# Patient Record
Sex: Female | Born: 1950 | Race: White | Hispanic: No | Marital: Single | Smoking: Never smoker
Health system: Southern US, Community
[De-identification: ages and names within clinical notes are randomized; demographics above are authoritative.]

## PROBLEM LIST (undated history)

## (undated) DIAGNOSIS — A64 Unspecified sexually transmitted disease: Secondary | ICD-10-CM

## (undated) DIAGNOSIS — H359 Unspecified retinal disorder: Secondary | ICD-10-CM

## (undated) DIAGNOSIS — M810 Age-related osteoporosis without current pathological fracture: Secondary | ICD-10-CM

## (undated) DIAGNOSIS — K219 Gastro-esophageal reflux disease without esophagitis: Secondary | ICD-10-CM

## (undated) DIAGNOSIS — E785 Hyperlipidemia, unspecified: Secondary | ICD-10-CM

## (undated) DIAGNOSIS — B019 Varicella without complication: Secondary | ICD-10-CM

## (undated) HISTORY — DX: Gastro-esophageal reflux disease without esophagitis: K21.9

## (undated) HISTORY — PX: TUBAL LIGATION: SHX77

## (undated) HISTORY — DX: Varicella without complication: B01.9

## (undated) HISTORY — DX: Age-related osteoporosis without current pathological fracture: M81.0

## (undated) HISTORY — DX: Unspecified retinal disorder: H35.9

## (undated) HISTORY — DX: Unspecified sexually transmitted disease: A64

## (undated) HISTORY — PX: EYE SURGERY: SHX253

## (undated) HISTORY — DX: Hyperlipidemia, unspecified: E78.5

---

## 1999-12-23 ENCOUNTER — Other Ambulatory Visit: Admission: RE | Admit: 1999-12-23 | Discharge: 1999-12-23 | Payer: Self-pay

## 1999-12-23 ENCOUNTER — Other Ambulatory Visit: Admission: RE | Admit: 1999-12-23 | Discharge: 1999-12-23 | Payer: Self-pay | Admitting: Gynecology

## 2001-01-04 ENCOUNTER — Other Ambulatory Visit: Admission: RE | Admit: 2001-01-04 | Discharge: 2001-01-04 | Payer: Self-pay | Admitting: Gynecology

## 2002-09-02 ENCOUNTER — Other Ambulatory Visit: Admission: RE | Admit: 2002-09-02 | Discharge: 2002-09-02 | Payer: Self-pay | Admitting: Gynecology

## 2003-11-02 ENCOUNTER — Other Ambulatory Visit: Admission: RE | Admit: 2003-11-02 | Discharge: 2003-11-02 | Payer: Self-pay | Admitting: Gynecology

## 2004-11-19 ENCOUNTER — Other Ambulatory Visit: Admission: RE | Admit: 2004-11-19 | Discharge: 2004-11-19 | Payer: Self-pay | Admitting: Gynecology

## 2005-12-22 ENCOUNTER — Other Ambulatory Visit: Admission: RE | Admit: 2005-12-22 | Discharge: 2005-12-22 | Payer: Self-pay | Admitting: Gynecology

## 2006-02-03 ENCOUNTER — Encounter: Admission: RE | Admit: 2006-02-03 | Discharge: 2006-02-03 | Payer: Self-pay | Admitting: Gynecology

## 2007-03-23 ENCOUNTER — Encounter: Admission: RE | Admit: 2007-03-23 | Discharge: 2007-03-23 | Payer: Self-pay | Admitting: Gynecology

## 2008-06-21 ENCOUNTER — Encounter: Admission: RE | Admit: 2008-06-21 | Discharge: 2008-06-21 | Payer: Self-pay | Admitting: Gynecology

## 2008-12-02 ENCOUNTER — Emergency Department (HOSPITAL_COMMUNITY): Admission: EM | Admit: 2008-12-02 | Discharge: 2008-12-02 | Payer: Self-pay | Admitting: Emergency Medicine

## 2008-12-05 ENCOUNTER — Emergency Department (HOSPITAL_COMMUNITY): Admission: EM | Admit: 2008-12-05 | Discharge: 2008-12-05 | Payer: Self-pay | Admitting: Emergency Medicine

## 2008-12-06 ENCOUNTER — Ambulatory Visit: Payer: Self-pay | Admitting: Internal Medicine

## 2008-12-06 DIAGNOSIS — E785 Hyperlipidemia, unspecified: Secondary | ICD-10-CM | POA: Insufficient documentation

## 2008-12-06 LAB — CONVERTED CEMR LAB
Alkaline Phosphatase: 70 units/L (ref 39–117)
BUN: 14 mg/dL (ref 6–23)
Basophils Absolute: 0.1 10*3/uL (ref 0.0–0.1)
Bilirubin, Direct: 0.1 mg/dL (ref 0.0–0.3)
CO2: 28 meq/L (ref 19–32)
Chloride: 105 meq/L (ref 96–112)
Eosinophils Absolute: 0.2 10*3/uL (ref 0.0–0.7)
HCT: 38.2 % (ref 36.0–46.0)
HDL: 50.8 mg/dL (ref >39.00)
Hemoglobin: 13.1 g/dL (ref 12.0–15.0)
Lymphs Abs: 1.3 10*3/uL (ref 0.7–4.0)
MCHC: 34.2 g/dL (ref 30.0–36.0)
MCV: 87 fL (ref 78.0–100.0)
Monocytes Relative: 7.5 % (ref 3.0–12.0)
RBC: 4.39 M/uL (ref 3.87–5.11)
RDW: 12 % (ref 11.5–14.6)
TSH: 2.15 microintl units/mL (ref 0.35–5.50)
Total Bilirubin: 1.4 mg/dL — ABNORMAL HIGH (ref 0.3–1.2)
Total Protein: 6.9 g/dL (ref 6.0–8.3)

## 2008-12-19 ENCOUNTER — Telehealth: Payer: Self-pay | Admitting: Internal Medicine

## 2009-12-12 ENCOUNTER — Ambulatory Visit: Payer: Self-pay | Admitting: Internal Medicine

## 2009-12-12 DIAGNOSIS — J069 Acute upper respiratory infection, unspecified: Secondary | ICD-10-CM | POA: Insufficient documentation

## 2010-03-26 NOTE — Assessment & Plan Note (Signed)
Summary: SINUS INF/CJR   Vital Signs:  Patient profile:   60 year old female Weight:      129 pounds Temp:     98.3 degrees F oral BP sitting:   110 / 70  (right arm) Cuff size:   regular  Vitals Entered By: Duard Brady LPN (December 12, 2009 10:14 AM) CC: sinus congestion and pressure    **declines flu vaccine Is Patient Diabetic? No   CC:  sinus congestion and pressure    **declines flu vaccine.  History of Present Illness: 60 year old patient who is seen today in with a two to 3 week history of sinus congestion, postnasal drip, and sinus pressure.  No localized sinus or dental pain.  No fever or other constitutional complaints.  No purulent drainage.  She states that she had a sinus infection that was quite severe.  years ago. she has a history of mild dyslipidemia diet controlled  Allergies: 1)  ! Bactrim (Sulfamethoxazole-Trimethoprim)  Past History:  Past Medical History: Reviewed history from 12/06/2008 and no changes required. Hyperlipidemia  Review of Systems  The patient denies anorexia, fever, weight loss, weight gain, vision loss, decreased hearing, hoarseness, chest pain, syncope, dyspnea on exertion, peripheral edema, prolonged cough, headaches, hemoptysis, abdominal pain, melena, hematochezia, severe indigestion/heartburn, hematuria, incontinence, genital sores, muscle weakness, suspicious skin lesions, transient blindness, difficulty walking, depression, unusual weight change, abnormal bleeding, enlarged lymph nodes, angioedema, and breast masses.    Physical Exam  General:  Well-developed,well-nourished,in no acute distress; alert,appropriate and cooperative throughout examination Head:  Normocephalic and atraumatic without obvious abnormalities. No apparent alopecia or balding. Eyes:  No corneal or conjunctival inflammation noted. EOMI. Perrla. Funduscopic exam benign, without hemorrhages, exudates or papilledema. Vision grossly normal. Ears:  External  ear exam shows no significant lesions or deformities.  Otoscopic examination reveals clear canals, tympanic membranes are intact bilaterally without bulging, retraction, inflammation or discharge. Hearing is grossly normal bilaterally. Mouth:  Oral mucosa and oropharynx without lesions or exudates.  Teeth in good repair. Neck:  No deformities, masses, or tenderness noted. Lungs:  Normal respiratory effort, chest expands symmetrically. Lungs are clear to auscultation, no crackles or wheezes.   Impression & Recommendations:  Problem # 1:  URI (ICD-465.9)  Problem # 2:  HYPERLIPIDEMIA (ICD-272.4)  Patient Instructions: 1)  Get plenty of rest, drink lots of clear liquids, and use Tylenol or Ibuprofen for fever and comfort. Return in 7-10 days if you're not better:sooner if you're feeling worse. 2)  Mucinex D twice daily 3)  Omnaris- use twice daily    Orders Added: 1)  Est. Patient Level III [62130]

## 2011-03-27 ENCOUNTER — Ambulatory Visit: Payer: BC Managed Care – PPO

## 2011-03-27 ENCOUNTER — Ambulatory Visit (INDEPENDENT_AMBULATORY_CARE_PROVIDER_SITE_OTHER): Payer: BC Managed Care – PPO | Admitting: Family Medicine

## 2011-03-27 VITALS — BP 148/82 | HR 68 | Temp 98.4°F | Resp 18 | Ht 64.0 in | Wt 124.0 lb

## 2011-03-27 DIAGNOSIS — R05 Cough: Secondary | ICD-10-CM

## 2011-03-27 DIAGNOSIS — R059 Cough, unspecified: Secondary | ICD-10-CM

## 2011-03-27 MED ORDER — LEVOFLOXACIN 500 MG PO TABS: 500.0000 mg | ORAL_TABLET | Freq: Every day | ORAL | Status: AC

## 2011-03-27 MED ORDER — HYDROCODONE-HOMATROPINE 5-1.5 MG/5ML PO SYRP: 5.0000 mL | ORAL_SOLUTION | Freq: Four times a day (QID) | ORAL | Status: AC | PRN

## 2011-03-27 NOTE — Progress Notes (Signed)
  Subjective:    Patient ID: Michelle Cook, female    DOB: Aug 04, 1950, 61 y.o.   MRN: [id number]  HPI    Review of Systems     Objective:   Physical Exam  [facility] reading (PRIMARY) by  Dr. Milus Glazier:  Scoliosis, no infiltrate       Assessment & Plan:

## 2011-03-27 NOTE — Patient Instructions (Addendum)
Health Maintenance, Females A healthy lifestyle and preventative care can promote health and wellness.  Maintain regular health, dental, and eye exams.   Eat a healthy diet. Foods like vegetables, fruits, whole grains, low-fat dairy products, and lean protein foods contain the nutrients you need without too many calories. Decrease your intake of foods high in solid fats, added sugars, and salt. Get information about a proper diet from your caregiver, if necessary.   Regular physical exercise is one of the most important things you can do for your health. Most adults should get at least 150 minutes of moderate-intensity exercise (any activity that increases your heart rate and causes you to sweat) each week. In addition, most adults need muscle-strengthening exercises on 2 or more days a week.    Maintain a healthy weight. The body mass index (BMI) is a screening tool to identify possible weight problems. It provides an estimate of body fat based on height and weight. Your caregiver can help determine your BMI, and can help you achieve or maintain a healthy weight. For adults 20 years and older:   A BMI below 18.5 is considered underweight.   A BMI of 18.5 to 24.9 is normal.   A BMI of 25 to 29.9 is considered overweight.   A BMI of 30 and above is considered obese.   Maintain normal blood lipids and cholesterol by exercising and minimizing your intake of saturated fat. Eat a balanced diet with plenty of fruits and vegetables. Blood tests for lipids and cholesterol should begin at age 20 and be repeated every 5 years. If your lipid or cholesterol levels are high, you are over 50, or you are a high risk for heart disease, you may need your cholesterol levels checked more frequently.Ongoing high lipid and cholesterol levels should be treated with medicines if diet and exercise are not effective.   If you smoke, find out from your caregiver how to quit. If you do not use tobacco, do not start.    If you are pregnant, do not drink alcohol. If you are breastfeeding, be very cautious about drinking alcohol. If you are not pregnant and choose to drink alcohol, do not exceed 1 drink per day. One drink is considered to be 12 ounces (355 mL) of beer, 5 ounces (148 mL) of wine, or 1.5 ounces (44 mL) of liquor.   Avoid use of street drugs. Do not share needles with anyone. Ask for help if you need support or instructions about stopping the use of drugs.   High blood pressure causes heart disease and increases the risk of stroke. Blood pressure should be checked at least every 1 to 2 years. Ongoing high blood pressure should be treated with medicines, if weight loss and exercise are not effective.   If you are 55 to 61 years old, ask your caregiver if you should take aspirin to prevent strokes.   Diabetes screening involves taking a blood sample to check your fasting blood sugar level. This should be done once every 3 years, after age 45, if you are within normal weight and without risk factors for diabetes. Testing should be considered at a younger age or be carried out more frequently if you are overweight and have at least 1 risk factor for diabetes.   Breast cancer screening is essential preventative care for women. You should practice "breast self-awareness." This means understanding the normal appearance and feel of your breasts and may include breast self-examination. Any changes detected, no matter how   small, should be reported to a caregiver. Women in their 20s and 30s should have a clinical breast exam (CBE) by a caregiver as part of a regular health exam every 1 to 3 years. After age 40, women should have a CBE every year. Starting at age 40, women should consider having a mammogram (breast X-ray) every year. Women who have a family history of breast cancer should talk to their caregiver about genetic screening. Women at a high risk of breast cancer should talk to their caregiver about having  an MRI and a mammogram every year.   The Pap test is a screening test for cervical cancer. Women should have a Pap test starting at age 21. Between ages 21 and 29, Pap tests should be repeated every 2 years. Beginning at age 30, you should have a Pap test every 3 years as long as the past 3 Pap tests have been normal. If you had a hysterectomy for a problem that was not cancer or a condition that could lead to cancer, then you no longer need Pap tests. If you are between ages 65 and 70, and you have had normal Pap tests going back 10 years, you no longer need Pap tests. If you have had past treatment for cervical cancer or a condition that could lead to cancer, you need Pap tests and screening for cancer for at least 20 years after your treatment. If Pap tests have been discontinued, risk factors (such as a new sexual partner) need to be reassessed to determine if screening should be resumed. Some women have medical problems that increase the chance of getting cervical cancer. In these cases, your caregiver may recommend more frequent screening and Pap tests.   The human papillomavirus (HPV) test is an additional test that may be used for cervical cancer screening. The HPV test looks for the virus that can cause the cell changes on the cervix. The cells collected during the Pap test can be tested for HPV. The HPV test could be used to screen women aged 30 years and older, and should be used in women of any age who have unclear Pap test results. After the age of 30, women should have HPV testing at the same frequency as a Pap test.   Colorectal cancer can be detected and often prevented. Most routine colorectal cancer screening begins at the age of 50 and continues through age 75. However, your caregiver may recommend screening at an earlier age if you have risk factors for colon cancer. On a yearly basis, your caregiver may provide home test kits to check for hidden blood in the stool. Use of a small camera at  the end of a tube, to directly examine the colon (sigmoidoscopy or colonoscopy), can detect the earliest forms of colorectal cancer. Talk to your caregiver about this at age 50, when routine screening begins. Direct examination of the colon should be repeated every 5 to 10 years through age 75, unless early forms of pre-cancerous polyps or small growths are found.   Practice safe sex. Use condoms and avoid high-risk sexual practices to reduce the spread of sexually transmitted infections (STIs). Sexually active women aged 25 and younger should be checked for Chlamydia, which is a common sexually transmitted infection. Older women with new or multiple partners should also be tested for Chlamydia. Testing for other STIs is recommended if you are sexually active and at increased risk.   Osteoporosis is a disease in which the bones lose minerals and   strength with aging. This can result in serious bone fractures. The risk of osteoporosis can be identified using a bone density scan. Women ages 52 and over and women at risk for fractures or osteoporosis should discuss screening with their caregivers. Ask your caregiver whether you should be taking a calcium supplement or vitamin D to reduce the rate of osteoporosis.   Menopause can be associated with physical symptoms and risks. Hormone replacement therapy is available to decrease symptoms and risks. You should talk to your caregiver about whether hormone replacement therapy is right for you.   Use sunscreen with a sun protection factor (SPF) of 30 or greater. Apply sunscreen liberally and repeatedly throughout the day. You should seek shade when your shadow is shorter than you. Protect yourself by wearing long sleeves, pants, a wide-brimmed hat, and sunglasses year round, whenever you are outdoors.   Notify your caregiver of new moles or changes in moles, especially if there is a change in shape or color. Also notify your caregiver if a mole is larger than the  size of a pencil eraser.   Stay current with your immunizations.  Document Released: 08/26/2010 Document Revised: 10/23/2010 Document Reviewed: 08/26/2010 [facility] Patient Information 2012 [place], [place].Bronchitis Bronchitis is the body's way of reacting to injury and/or infection (inflammation) of the bronchi. Bronchi are the air tubes that extend from the windpipe into the lungs. If the inflammation becomes severe, it may cause shortness of breath. CAUSES  Inflammation may be caused by:  A virus.   Germs (bacteria).   Dust.   Allergens.   Pollutants and many other irritants.  The cells lining the bronchial tree are covered with tiny hairs (cilia). These constantly beat upward, away from the lungs, toward the mouth. This keeps the lungs free of pollutants. When these cells become too irritated and are unable to do their job, mucus begins to develop. This causes the characteristic cough of bronchitis. The cough clears the lungs when the cilia are unable to do their job. Without either of these protective mechanisms, the mucus would settle in the lungs. Then you would develop pneumonia. Smoking is a common cause of bronchitis and can contribute to pneumonia. Stopping this habit is the single most important thing you can do to help yourself. TREATMENT   Your caregiver may prescribe an antibiotic if the cough is caused by bacteria. Also, medicines that open up your airways make it easier to breathe. Your caregiver may also recommend or prescribe an expectorant. It will loosen the mucus to be coughed up. Only take over-the-counter or prescription medicines for pain, discomfort, or fever as directed by your caregiver.   Removing whatever causes the problem (smoking, for example) is critical to preventing the problem from getting worse.   Cough suppressants may be prescribed for relief of cough symptoms.   Inhaled medicines may be prescribed to help with symptoms now and to help prevent  problems from returning.   For those with recurrent (chronic) bronchitis, there may be a need for steroid medicines.  SEEK IMMEDIATE MEDICAL CARE IF:   During treatment, you develop more pus-like mucus (purulent sputum).   You have a fever.   Your baby is older than 3 months with a rectal temperature of 102 F (38.9 C) or higher.   Your baby is 53 months old or younger with a rectal temperature of 100.4 F (38 C) or higher.   You become progressively more ill.   You have increased difficulty breathing, wheezing, or shortness  of breath.  It is necessary to seek immediate medical care if you are elderly or sick from any other disease. MAKE SURE YOU:   Understand these instructions.   Will watch your condition.   Will get help right away if you are not doing well or get worse.  Document Released: 02/10/2005 Document Revised: 10/23/2010 Document Reviewed: 12/21/2007 [facility] Patient Information 2012 [place], [place].

## 2011-03-27 NOTE — Progress Notes (Signed)
  Subjective:    Patient ID: Michelle Cook, female    DOB: 1950-05-01, 61 y.o.   MRN: [id number]  URI  This is a new problem. The current episode started 1 to 4 weeks ago. The problem has been gradually worsening. There has been no fever. Associated symptoms include coughing. Pertinent negatives include no congestion or ear pain. She has tried decongestant for the symptoms. The treatment provided no relief.  Cough Pertinent negatives include no ear pain.      Review of Systems  HENT: Negative for ear pain and congestion.   Respiratory: Positive for cough.   All other systems reviewed and are negative.       Objective:   Physical Exam  Constitutional: She is oriented to person, place, and time. She appears well-developed and well-nourished.  HENT:  Head: Normocephalic and atraumatic.  Right Ear: External ear normal.  Left Ear: External ear normal.  Mouth/Throat: Oropharynx is clear and moist.  Eyes: Conjunctivae are normal. Pupils are equal, round, and reactive to light.  Neck: Normal range of motion. Neck supple.  Cardiovascular: Normal rate, regular rhythm, normal heart sounds and intact distal pulses.   Pulmonary/Chest: She has rales (right lower lobe).  Musculoskeletal: Normal range of motion.  Neurological: She is alert and oriented to person, place, and time.  Skin: Skin is warm and dry.  Psychiatric: She has a normal mood and affect. Her behavior is normal. Judgment and thought content normal.          Assessment & Plan:

## 2011-04-01 ENCOUNTER — Other Ambulatory Visit: Payer: Self-pay | Admitting: Family Medicine

## 2011-04-03 ENCOUNTER — Telehealth: Payer: Self-pay

## 2011-04-03 NOTE — Telephone Encounter (Signed)
Patient saying that she is not completely better from her visit from Dr L, would like another antibiotic called in if possible   [facility] on [address]

## 2011-04-04 ENCOUNTER — Other Ambulatory Visit: Payer: Self-pay | Admitting: Family Medicine

## 2011-04-04 MED ORDER — AMOXICILLIN 875 MG PO TABS: 875.0000 mg | ORAL_TABLET | Freq: Two times a day (BID) | ORAL | Status: AC

## 2011-04-04 NOTE — Telephone Encounter (Signed)
I ordered amoxicillin at pharmacy

## 2011-04-04 NOTE — Telephone Encounter (Signed)
Notified pt Abx sent to pharmacy.

## 2012-05-18 ENCOUNTER — Ambulatory Visit (INDEPENDENT_AMBULATORY_CARE_PROVIDER_SITE_OTHER): Payer: Self-pay | Admitting: Internal Medicine

## 2012-05-18 ENCOUNTER — Encounter: Payer: Self-pay | Admitting: Internal Medicine

## 2012-05-18 VITALS — BP 120/80 | HR 64 | Temp 98.0°F | Resp 18 | Wt 117.0 lb

## 2012-05-18 DIAGNOSIS — R8290 Unspecified abnormal findings in urine: Secondary | ICD-10-CM

## 2012-05-18 DIAGNOSIS — R82998 Other abnormal findings in urine: Secondary | ICD-10-CM

## 2012-05-18 DIAGNOSIS — M545 Low back pain, unspecified: Secondary | ICD-10-CM

## 2012-05-18 LAB — POCT URINALYSIS DIPSTICK
Nitrite, UA: POSITIVE
Spec Grav, UA: 1.01

## 2012-05-18 MED ORDER — CIPROFLOXACIN HCL 500 MG PO TABS: 500.0000 mg | ORAL_TABLET | Freq: Two times a day (BID) | ORAL | Status: DC

## 2012-05-18 NOTE — Progress Notes (Signed)
  Subjective:    Patient ID: Michelle Cook, female    DOB: 01/05/1951, 62 y.o.   MRN: [id number]  HPI  62 year old patient who presents with a third a history of low back pain. She also describes concentrated urine with a glide in strong odor. Urinalysis was reviewed and did reveal positive nitrates pyuria and small amount of hematuria. Patient denies any fever urinary frequency urgency or dysuria. She has had remote urinary tract infections only.  History reviewed. No pertinent past medical history.  History   Social History  . Marital Status: Single    Spouse Name: N/A    Number of Children: N/A  . Years of Education: N/A   Occupational History  . Not on file.   Social History Main Topics  . Smoking status: Never Smoker   . Smokeless tobacco: Not on file  . Alcohol Use: Not on file  . Drug Use: Not on file  . Sexually Active: Not on file   Other Topics Concern  . Not on file   Social History Narrative  . No narrative on file    History reviewed. No pertinent past surgical history.  No family history on file.  Allergies  Allergen Reactions  . Sulfamethoxazole W-Trimethoprim   . Sulfa Antibiotics Rash    No current outpatient prescriptions on file prior to visit.   No current facility-administered medications on file prior to visit.    BP 120/80  Pulse 64  Temp(Src) 98 F (36.7 C) (Oral)  Resp 18  Wt 117 lb (53.071 kg)  BMI 20.07 kg/m2  SpO2 99%        Review of Systems  Constitutional: Negative.   HENT: Negative for hearing loss, congestion, sore throat, rhinorrhea, dental problem, sinus pressure and tinnitus.   Eyes: Negative for pain, discharge and visual disturbance.  Respiratory: Negative for cough and shortness of breath.   Cardiovascular: Negative for chest pain, palpitations and leg swelling.  Gastrointestinal: Negative for nausea, vomiting, abdominal pain, diarrhea, constipation, blood in stool and abdominal distention.  Genitourinary:  Negative for dysuria, urgency, frequency, hematuria, flank pain, vaginal bleeding, vaginal discharge, difficulty urinating, vaginal pain and pelvic pain.  Musculoskeletal: Positive for back pain. Negative for joint swelling, arthralgias and gait problem.  Skin: Negative for rash.  Neurological: Negative for dizziness, syncope, speech difficulty, weakness, numbness and headaches.  Hematological: Negative for adenopathy.  Psychiatric/Behavioral: Negative for behavioral problems, dysphoric mood and agitation. The patient is not nervous/anxious.        Objective:   Physical Exam  Constitutional: She appears well-developed and well-nourished. She appears distressed.  Abdominal: Soft. Bowel sounds are normal. She exhibits no distension. There is no tenderness. There is no rebound.  Musculoskeletal: She exhibits no tenderness.  Scoliosis lumbar spine          Assessment & Plan:   Early UTI. Will treat with a three-day course of Cipro Low back pain. Suspect musculoligamentous. Will continued anti-inflammatory medications.

## 2012-05-18 NOTE — Patient Instructions (Signed)
Take your antibiotic as prescribed until ALL of it is gone, but stop if you develop a rash, swelling, or any side effects of the medication.  Contact our office as soon as possible if  there are side effects of the medication.  Drink as much fluid as you  can tolerate over the next few days 

## 2012-05-21 ENCOUNTER — Telehealth: Payer: Self-pay | Admitting: Internal Medicine

## 2012-05-21 NOTE — Telephone Encounter (Signed)
Spoke to pt told her do not need to repeat urine unless having symptoms and make sure complete all antibiotic. Pt verbalized understanding and stated did finish abx.

## 2012-05-21 NOTE — Telephone Encounter (Signed)
Pt was seen on 3-25 and had protein and blood in urine. Pt was on abx for 3 days. Pt would like to know should she repeat UA.

## 2012-06-30 ENCOUNTER — Ambulatory Visit: Payer: BC Managed Care – PPO

## 2012-06-30 ENCOUNTER — Other Ambulatory Visit: Payer: Self-pay | Admitting: Family Medicine

## 2012-06-30 ENCOUNTER — Ambulatory Visit (INDEPENDENT_AMBULATORY_CARE_PROVIDER_SITE_OTHER): Payer: BC Managed Care – PPO | Admitting: Family Medicine

## 2012-06-30 VITALS — BP 134/78 | HR 66 | Temp 98.3°F | Resp 16 | Ht 64.5 in | Wt 111.0 lb

## 2012-06-30 DIAGNOSIS — R634 Abnormal weight loss: Secondary | ICD-10-CM

## 2012-06-30 DIAGNOSIS — R059 Cough, unspecified: Secondary | ICD-10-CM

## 2012-06-30 DIAGNOSIS — R05 Cough: Secondary | ICD-10-CM

## 2012-06-30 DIAGNOSIS — R509 Fever, unspecified: Secondary | ICD-10-CM

## 2012-06-30 LAB — POCT CBC
Granulocyte percent: 79.2 %G (ref 37–80)
Lymph, poc: 1.6 (ref 0.6–3.4)
MCV: 88.1 fL (ref 80–97)
MPV: 10.2 fL (ref 0–99.8)
POC Granulocyte: 7.8 — AB (ref 2–6.9)
POC LYMPH PERCENT: 15.8 %L (ref 10–50)
POC MID %: 5 %M (ref 0–12)
WBC: 9.9 10*3/uL (ref 4.6–10.2)

## 2012-06-30 LAB — COMPREHENSIVE METABOLIC PANEL
AST: 23 U/L (ref 0–37)
BUN: 10 mg/dL (ref 6–23)
CO2: 26 mEq/L (ref 19–32)
Sodium: 140 mEq/L (ref 135–145)

## 2012-06-30 MED ORDER — AZITHROMYCIN 250 MG PO TABS: ORAL_TABLET | ORAL | Status: DC

## 2012-06-30 NOTE — Progress Notes (Addendum)
[facility] [address], [place] [address] [id number] [phone]  Date:  06/30/2012   Name:  Michelle Cook   DOB:  Jul 12, 1950   MRN:  [id number]  PCP:  Rogelia Boga, MD    Chief Complaint: Cough, Nasal Congestion, Fever, Extremity Weakness and Anorexia   History of Present Illness:  Michelle Cook is a 62 y.o. very pleasant female patient who presents with the following:  Here today with illness. In March of this year she began to notice a cough and congestion.  The congestion has resolved, but she has continued to cough. "It is like a smoker's cough but I do not smoke."   She has felt tired and fatigued.  She has lost about 15 lbs over the last 8 months.  She has not been trying to lose weight.  She has been working a lot and been under stress due to tax season (she works for a [employer])   Last night she noted a low grade fever up to just over 100 degrees.  The fever has resolved today.   She has noted some body aches and chills.    She had a UTI in February which was treated by her PCP.  These symptoms have cleared up.    She had a colonoscopy in her early 11's, less than 10 years ago.  It was normal Her last mammo was 2 or 3 years ago.  It was normal  She has not noted any fever except for last night, no night sweats.  When she was a young child her grandmother tested positive for TB and Adaria was treated with medicaiton for TB.  Besides this she is not aware of any TB exposure.  She has not noted any hemotyosis.    Patient Active Problem List   Diagnosis Date Noted  . URI 12/12/2009  . HYPERLIPIDEMIA 12/06/2008    History reviewed. No pertinent past medical history.  Past Surgical History  Procedure Laterality Date  . Tubal ligation      History  Substance Use Topics  . Smoking status: Never Smoker   . Smokeless tobacco: Not on file  . Alcohol Use: Not on file    History reviewed. No pertinent family history.  Allergies   Allergen Reactions  . Sulfamethoxazole W-Trimethoprim   . Sulfa Antibiotics Rash    Medication list has been reviewed and updated.  Current Outpatient Prescriptions on File Prior to Visit  Medication Sig Dispense Refill  . ibuprofen (ADVIL,MOTRIN) 200 MG tablet Take 400 mg by mouth every 6 (six) hours as needed for pain.      . ciprofloxacin (CIPRO) 500 MG tablet Take 1 tablet (500 mg total) by mouth 2 (two) times daily.  6 tablet  0  . naproxen sodium (ANAPROX) 220 MG tablet Take 220 mg by mouth 2 (two) times daily with a meal.       No current facility-administered medications on file prior to visit.    Review of Systems:  As per HPI- otherwise negative.   Physical Examination: Filed Vitals:   06/30/12 0847  BP: 134/78  Pulse: 66  Temp: 98.3 F (36.8 C)  Resp: 16   Filed Vitals:   06/30/12 0847  Height: 5' 4.5" (1.638 m)  Weight: 111 lb (50.349 kg)   Body mass index is 18.77 kg/(m^2). Ideal Body Weight: Weight in (lb) to have BMI = 25: 147.6  GEN: WDWN, NAD, Non-toxic, A & O x 3, thin build HEENT:  Atraumatic, Normocephalic. Neck supple. No masses, No LAD.  Bilateral TM wnl, oropharynx normal.  PEERL,EOMI.   Ears and Nose: No external deformity. CV: RRR, No M/G/R. No JVD. No thrill. No extra heart sounds. PULM: CTA B, no wheezes, crackles, rhonchi. No retractions. No resp. distress. No accessory muscle use. ABD: S, NT, ND, +BS. No rebound. No HSM. EXTR: No c/c/e NEURO Normal gait.  PSYCH: Normally interactive. Conversant. Not depressed or anxious appearing.  Calm demeanor.   [facility] reading (PRIMARY) by  Dr. Patsy Lager. CXR: negative.  Significant thoracic scoliosis.   CHEST - 2 VIEW  Comparison: 03/27/2011  Findings: The heart and pulmonary vascularity are within normal limits. The lungs are mildly hyperinflated stable from prior exam. A scoliosis centered at the upper lumbar spine is again noted and stable.  IMPRESSION: Stable appearance of the chest. No  acute abnormality is noted.   Results for orders placed in visit on 06/30/12  POCT CBC      Result Value Range   WBC 9.9  4.6 - 10.2 K/uL   Lymph, poc 1.6  0.6 - 3.4   POC LYMPH PERCENT 15.8  10 - 50 %L   MID (cbc) 0.5  0 - 0.9   POC MID % 5.0  0 - 12 %M   POC Granulocyte 7.8 (*) 2 - 6.9   Granulocyte percent 79.2  37 - 80 %G   RBC 4.89  4.04 - 5.48 M/uL   Hemoglobin 13.6  12.2 - 16.2 g/dL   HCT, POC 16.1  09.6 - 47.9 %   MCV 88.1  80 - 97 fL   MCH, POC 27.8  27 - 31.2 pg   MCHC 31.6 (*) 31.8 - 35.4 g/dL   RDW, POC 04.5     Platelet Count, POC 290  142 - 424 K/uL   MPV 10.2  0 - 99.8 fL    Assessment and Plan: Cough - Plan: DG Chest 2 View, azithromycin (ZITHROMAX) 250 MG tablet  Fever, unspecified  Loss of weight - Plan: POCT CBC, Comprehensive metabolic panel, TSH, DG Chest 2 View  Persistent cough- will treat for bronchitis with azithromycin, which will also cover for pertussis as she has a long- term cough. Weight loss- did discuss with Jorgia that we need to look for cancer due to unintended weight loss.  Plan to follow -up with her labs, and encouraged her to schedule her mammogram and a CPE with her PCP.    Signed Abbe Amsterdam, MD  Results for orders placed in visit on 06/30/12  COMPREHENSIVE METABOLIC PANEL      Result Value Range   Sodium 140  135 - 145 mEq/L   Potassium 3.9  3.5 - 5.3 mEq/L   Chloride 104  96 - 112 mEq/L   CO2 26  19 - 32 mEq/L   Glucose, Bld 103 (*) 70 - 99 mg/dL   BUN 10  6 - 23 mg/dL   Creat 4.09  8.11 - 9.14 mg/dL   Total Bilirubin 1.9 (*) 0.3 - 1.2 mg/dL   Alkaline Phosphatase 96  39 - 117 U/L   AST 23  0 - 37 U/L   ALT 26  0 - 35 U/L   Total Protein 7.2  6.0 - 8.3 g/dL   Albumin 4.5  3.5 - 5.2 g/dL   Calcium 9.5  8.4 - 78.2 mg/dL  TSH      Result Value Range   TSH 1.205  0.350 - 4.500 uIU/mL  POCT CBC  Result Value Range   WBC 9.9  4.6 - 10.2 K/uL   Lymph, poc 1.6  0.6 - 3.4   POC LYMPH PERCENT 15.8  10 - 50 %L    MID (cbc) 0.5  0 - 0.9   POC MID % 5.0  0 - 12 %M   POC Granulocyte 7.8 (*) 2 - 6.9   Granulocyte percent 79.2  37 - 80 %G   RBC 4.89  4.04 - 5.48 M/uL   Hemoglobin 13.6  12.2 - 16.2 g/dL   HCT, POC 96.0  45.4 - 47.9 %   MCV 88.1  80 - 97 fL   MCH, POC 27.8  27 - 31.2 pg   MCHC 31.6 (*) 31.8 - 35.4 g/dL   RDW, POC 09.8     Platelet Count, POC 290  142 - 424 K/uL   MPV 10.2  0 - 99.8 fL   Called her back- went over labs.  So far ok, will fractionate her bilirubin for her.  Again reinforced that she must see her PCP for a CPE, and do her mammogram.

## 2012-06-30 NOTE — Patient Instructions (Addendum)
Please let me know if you are worse or not better in the next few days. Be sure to schedule your mammogram and a physical exam with your regular doctor. I will be in touch with the rest of your labs.

## 2012-07-02 ENCOUNTER — Encounter: Payer: Self-pay | Admitting: Family Medicine

## 2012-07-02 LAB — BILIRUBIN, FRACTIONATED(TOT/DIR/INDIR)
Indirect Bilirubin: 1.6 mg/dL — ABNORMAL HIGH (ref 0.0–0.9)
Total Bilirubin: 1.9 mg/dL — ABNORMAL HIGH (ref 0.3–1.2)

## 2012-07-07 ENCOUNTER — Encounter: Payer: Self-pay | Admitting: Family Medicine

## 2013-01-27 ENCOUNTER — Ambulatory Visit (INDEPENDENT_AMBULATORY_CARE_PROVIDER_SITE_OTHER): Payer: BC Managed Care – PPO | Admitting: Physician Assistant

## 2013-01-27 ENCOUNTER — Encounter: Payer: Self-pay | Admitting: Physician Assistant

## 2013-01-27 VITALS — BP 126/78 | HR 83 | Temp 98.1°F | Resp 14 | Ht 64.5 in | Wt 113.8 lb

## 2013-01-27 DIAGNOSIS — J209 Acute bronchitis, unspecified: Secondary | ICD-10-CM

## 2013-01-27 MED ORDER — AZITHROMYCIN 250 MG PO TABS: ORAL_TABLET | ORAL | Status: DC

## 2013-01-27 NOTE — Progress Notes (Signed)
Pre visit review using our clinic review tool, if applicable. No additional management support is needed unless otherwise documented below in the visit note/SLS  

## 2013-01-27 NOTE — Progress Notes (Signed)
Patient ID: Michelle Cook, female   DOB: 1950/09/01, 61 y.o.   MRN: [id number]  Patient presents to clinic today complaining of chest congestion and malaise for 2 weeks. Patient endorses some body aches and fever with onset of symptoms that since subsided. Denies history of asthma or allergy. Denies recent travel or sick contact.  Patient states that cough has become productive.  Patient endorses some chest tenderness with repeated coughing.  Denies shortness of breath or wheezing.  No past medical history on file.  Current Outpatient Prescriptions on File Prior to Visit  Medication Sig Dispense Refill  . ibuprofen (ADVIL,MOTRIN) 200 MG tablet Take 400 mg by mouth every 6 (six) hours as needed for pain.       No current facility-administered medications on file prior to visit.    Allergies  Allergen Reactions  . Sulfamethoxazole-Trimethoprim   . Sulfa Antibiotics Rash    No family history on file.  History   Social History  . Marital Status: Single    Spouse Name: N/A    Number of Children: N/A  . Years of Education: N/A   Social History Main Topics  . Smoking status: Never Smoker   . Smokeless tobacco: None  . Alcohol Use: None  . Drug Use: None  . Sexual Activity: None   Other Topics Concern  . None   Social History Narrative  . None   Review of Systems - See HPI.  All other ROS are negative.  Filed Vitals:   01/27/13 1510  BP: 126/78  Pulse: 83  Temp: 98.1 F (36.7 C)  Resp: 14    Physical Exam  Vitals reviewed. Constitutional: She is oriented to person, place, and time and well-developed, well-nourished, and in no distress.  HENT:  Head: Normocephalic and atraumatic.  Right Ear: External ear normal.  Left Ear: External ear normal.  Nose: Nose normal.  Mouth/Throat: Oropharynx is clear and moist. No oropharyngeal exudate.  Tympanic membranes within normal limits. No tenderness with percussion of sinuses.  Eyes: Conjunctivae are normal.  Neck: Neck  supple.  Cardiovascular: Normal rate, regular rhythm, normal heart sounds and intact distal pulses.   Pulmonary/Chest: Effort normal and breath sounds normal. No respiratory distress. She has no wheezes. She has no rales. She exhibits no tenderness.  Neurological: She is alert and oriented to person, place, and time.  Skin: Skin is warm and dry. No rash noted.  Psychiatric: Affect normal.    Assessment/Plan: Acute bronchitis Physical exam within normal limits. Giving worsening symptoms, will Rx azithromycin. Increase fluid intake. Rest. Probiotic. Saline nasal spray. Please humidifier in bedroom. Delsym for cough.

## 2013-01-27 NOTE — Patient Instructions (Signed)
Please take antibiotic as prescribed.  Increase fluid intake.  Take probiotic and daily multivitamin.  Saline nasal spray.  Humidifier in bedroom.  Delsym for cough.

## 2013-01-29 DIAGNOSIS — J209 Acute bronchitis, unspecified: Secondary | ICD-10-CM | POA: Insufficient documentation

## 2013-01-29 NOTE — Assessment & Plan Note (Signed)
Physical exam within normal limits. Giving worsening symptoms, will Rx azithromycin. Increase fluid intake. Rest. Probiotic. Saline nasal spray. Please humidifier in bedroom. Delsym for cough.

## 2013-05-10 ENCOUNTER — Ambulatory Visit (INDEPENDENT_AMBULATORY_CARE_PROVIDER_SITE_OTHER): Payer: BC Managed Care – PPO | Admitting: Family

## 2013-05-10 ENCOUNTER — Encounter: Payer: Self-pay | Admitting: Family

## 2013-05-10 VITALS — BP 96/62 | HR 63 | Temp 98.0°F | Wt 111.0 lb

## 2013-05-10 DIAGNOSIS — R300 Dysuria: Secondary | ICD-10-CM

## 2013-05-10 DIAGNOSIS — N390 Urinary tract infection, site not specified: Secondary | ICD-10-CM

## 2013-05-10 LAB — POCT URINALYSIS DIPSTICK
Bilirubin, UA: NEGATIVE
Glucose, UA: NEGATIVE
KETONES UA: NEGATIVE
NITRITE UA: POSITIVE
PH UA: 6.5
Protein, UA: NEGATIVE
Spec Grav, UA: 1.02
UROBILINOGEN UA: 0.2

## 2013-05-10 MED ORDER — CIPROFLOXACIN HCL 250 MG PO TABS: 250.0000 mg | ORAL_TABLET | Freq: Two times a day (BID) | ORAL | Status: DC

## 2013-05-10 NOTE — Progress Notes (Signed)
   Subjective:    Patient ID: Michelle Cook, female    DOB: Mar 25, 1950, 63 y.o.   MRN: [id number]  HPI  63 year old caucasian, nonsmoker presenting today for bladder infection. She has frequency,burning, pressure, and a metallic odor x 3 days. She had a a bladder infection about this same time last year.     Review of Systems  Constitutional: Negative.   Respiratory: Negative.   Cardiovascular: Negative.   Genitourinary: Positive for pelvic pain.       Frequency, burning, pressure and metallic odor   History reviewed. No pertinent past medical history.  History   Social History  . Marital Status: Single    Spouse Name: N/A    Number of Children: N/A  . Years of Education: N/A   Occupational History  . Not on file.   Social History Main Topics  . Smoking status: Never Smoker   . Smokeless tobacco: Not on file  . Alcohol Use: Not on file  . Drug Use: Not on file  . Sexual Activity: Not on file   Other Topics Concern  . Not on file   Social History Narrative  . No narrative on file    Past Surgical History  Procedure Laterality Date  . Tubal ligation      History reviewed. No pertinent family history.  Allergies  Allergen Reactions  . Sulfamethoxazole-Trimethoprim   . Sulfa Antibiotics Rash    Current Outpatient Prescriptions on File Prior to Visit  Medication Sig Dispense Refill  . Probiotic Product (PROBIOTIC DAILY) CAPS Take 1 each by mouth daily.      Marland Kitchen. ibuprofen (ADVIL,MOTRIN) 200 MG tablet Take 400 mg by mouth every 6 (six) hours as needed for pain.       No current facility-administered medications on file prior to visit.    BP 96/62  Pulse 63  Temp(Src) 98 F (36.7 C) (Oral)  Wt 111 lb (50.349 kg)chart    Objective:   Physical Exam  Constitutional: She is oriented to person, place, and time. She appears well-developed and well-nourished.  Cardiovascular: Normal rate, regular rhythm and normal heart sounds.   Pulmonary/Chest: Effort  normal and breath sounds normal.  Abdominal: Soft. Bowel sounds are normal. There is no tenderness.  Neurological: She is alert and oriented to person, place, and time.  Skin: Skin is warm and dry.          Assessment & Plan:  Assessment 1. UTI 2. Dysuria  Plan 1. Cipro 250 mg PO BID x 5 days.  2.  Drink plenty of water and cranberry juice.  3. Reduce intake of caffeine.  4.  Contact office for questions or concerns.

## 2013-05-10 NOTE — Patient Instructions (Signed)
Urinary Tract Infection  Urinary tract infections (UTIs) can develop anywhere along your urinary tract. Your urinary tract is your body's drainage system for removing wastes and extra water. Your urinary tract includes two kidneys, two ureters, a bladder, and a urethra. Your kidneys are a pair of bean-shaped organs. Each kidney is about the size of your fist. They are located below your ribs, one on each side of your spine.  CAUSES  Infections are caused by microbes, which are microscopic organisms, including fungi, viruses, and bacteria. These organisms are so small that they can only be seen through a microscope. Bacteria are the microbes that most commonly cause UTIs.  SYMPTOMS   Symptoms of UTIs may vary by age and gender of the patient and by the location of the infection. Symptoms in young women typically include a frequent and intense urge to urinate and a painful, burning feeling in the bladder or urethra during urination. Older women and men are more likely to be tired, shaky, and weak and have muscle aches and abdominal pain. A fever may mean the infection is in your kidneys. Other symptoms of a kidney infection include pain in your back or sides below the ribs, nausea, and vomiting.  DIAGNOSIS  To diagnose a UTI, your caregiver will ask you about your symptoms. Your caregiver also will ask to provide a urine sample. The urine sample will be tested for bacteria and white blood cells. White blood cells are made by your body to help fight infection.  TREATMENT   Typically, UTIs can be treated with medication. Because most UTIs are caused by a bacterial infection, they usually can be treated with the use of antibiotics. The choice of antibiotic and length of treatment depend on your symptoms and the type of bacteria causing your infection.  HOME CARE INSTRUCTIONS   If you were prescribed antibiotics, take them exactly as your caregiver instructs you. Finish the medication even if you feel better after you  have only taken some of the medication.   Drink enough water and fluids to keep your urine clear or pale yellow.   Avoid caffeine, tea, and carbonated beverages. They tend to irritate your bladder.   Empty your bladder often. Avoid holding urine for long periods of time.   Empty your bladder before and after sexual intercourse.   After a bowel movement, women should cleanse from front to back. Use each tissue only once.  SEEK MEDICAL CARE IF:    You have back pain.   You develop a fever.   Your symptoms do not begin to resolve within 3 days.  SEEK IMMEDIATE MEDICAL CARE IF:    You have severe back pain or lower abdominal pain.   You develop chills.   You have nausea or vomiting.   You have continued burning or discomfort with urination.  MAKE SURE YOU:    Understand these instructions.   Will watch your condition.   Will get help right away if you are not doing well or get worse.  Document Released: 11/20/2004 Document Revised: 08/12/2011 Document Reviewed: 03/21/2011  [employer] Patient Information 2014 [employer]

## 2013-05-10 NOTE — Progress Notes (Signed)
Pre visit review using our clinic review tool, if applicable. No additional management support is needed unless otherwise documented below in the visit note. 

## 2013-05-13 LAB — URINE CULTURE

## 2013-06-17 ENCOUNTER — Other Ambulatory Visit (INDEPENDENT_AMBULATORY_CARE_PROVIDER_SITE_OTHER): Payer: BC Managed Care – PPO

## 2013-06-17 DIAGNOSIS — Z0000 Encounter for general adult medical examination without abnormal findings: Secondary | ICD-10-CM

## 2013-06-17 LAB — CBC WITH DIFFERENTIAL/PLATELET
Basophils Absolute: 0.1 10*3/uL (ref 0.0–0.1)
Basophils Relative: 1.3 % (ref 0.0–3.0)
EOS ABS: 0.1 10*3/uL (ref 0.0–0.7)
Eosinophils Relative: 2.5 % (ref 0.0–5.0)
HCT: 41.3 % (ref 36.0–46.0)
Hemoglobin: 13.8 g/dL (ref 12.0–15.0)
LYMPHS ABS: 1.8 10*3/uL (ref 0.7–4.0)
Lymphocytes Relative: 35.4 % (ref 12.0–46.0)
MCHC: 33.3 g/dL (ref 30.0–36.0)
MCV: 86.3 fl (ref 78.0–100.0)
MONO ABS: 0.3 10*3/uL (ref 0.1–1.0)
Monocytes Relative: 5.8 % (ref 3.0–12.0)
NEUTROS ABS: 2.7 10*3/uL (ref 1.4–7.7)
NEUTROS PCT: 55 % (ref 43.0–77.0)
PLATELETS: 307 10*3/uL (ref 150.0–400.0)
RBC: 4.79 Mil/uL (ref 3.87–5.11)
RDW: 13.3 % (ref 11.5–14.6)
WBC: 5 10*3/uL (ref 4.5–10.5)

## 2013-06-17 LAB — HEPATIC FUNCTION PANEL
ALBUMIN: 4 g/dL (ref 3.5–5.2)
ALK PHOS: 73 U/L (ref 39–117)
ALT: 25 U/L (ref 0–35)
AST: 25 U/L (ref 0–37)
BILIRUBIN TOTAL: 1.1 mg/dL (ref 0.3–1.2)
Bilirubin, Direct: 0 mg/dL (ref 0.0–0.3)
TOTAL PROTEIN: 7.1 g/dL (ref 6.0–8.3)

## 2013-06-17 LAB — POCT URINALYSIS DIPSTICK
Bilirubin, UA: NEGATIVE
GLUCOSE UA: NEGATIVE
KETONES UA: NEGATIVE
Leukocytes, UA: NEGATIVE
Nitrite, UA: NEGATIVE
PH UA: 6.5
SPEC GRAV UA: 1.015
UROBILINOGEN UA: 0.2

## 2013-06-17 LAB — TSH: TSH: 1.64 u[IU]/mL (ref 0.35–5.50)

## 2013-06-17 LAB — LIPID PANEL
Cholesterol: 193 mg/dL (ref 0–200)
HDL: 53.2 mg/dL (ref >39.00)
LDL Cholesterol: 113 mg/dL — ABNORMAL HIGH (ref 0–99)
Total CHOL/HDL Ratio: 4
Triglycerides: 132 mg/dL (ref 0.0–149.0)
VLDL: 26.4 mg/dL (ref 0.0–40.0)

## 2013-06-17 LAB — BASIC METABOLIC PANEL
BUN: 16 mg/dL (ref 6–23)
CHLORIDE: 106 meq/L (ref 96–112)
CO2: 24 meq/L (ref 19–32)
Calcium: 9.9 mg/dL (ref 8.4–10.5)
Creatinine, Ser: 0.9 mg/dL (ref 0.4–1.2)
GFR: 64.74 mL/min (ref >60.00)
GLUCOSE: 89 mg/dL (ref 70–99)
Potassium: 4 mEq/L (ref 3.5–5.1)
SODIUM: 139 meq/L (ref 135–145)

## 2013-06-24 ENCOUNTER — Ambulatory Visit (INDEPENDENT_AMBULATORY_CARE_PROVIDER_SITE_OTHER): Payer: BC Managed Care – PPO | Admitting: Internal Medicine

## 2013-06-24 ENCOUNTER — Encounter: Payer: Self-pay | Admitting: Internal Medicine

## 2013-06-24 VITALS — BP 110/70 | HR 56 | Temp 98.5°F | Resp 18 | Ht 63.0 in | Wt 113.0 lb

## 2013-06-24 DIAGNOSIS — Z0000 Encounter for general adult medical examination without abnormal findings: Secondary | ICD-10-CM

## 2013-06-24 DIAGNOSIS — E785 Hyperlipidemia, unspecified: Secondary | ICD-10-CM

## 2013-06-24 NOTE — Progress Notes (Signed)
   Subjective:    Patient ID: Michelle JamesCynthia L Scruton, female    DOB: 05/17/1950, 63 y.o.   MRN: [id number]  HPI  63 year old patient who is seen today for a health maintenance exam  Alcohol-Tobacco  Smoking Status: never  Caffeine-Diet-Exercise  Does Patient Exercise: yes   Allergies (verified):  1) ! 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  HENT: Negative for congestion, dental problem, ear pain, hearing loss, mouth sores, nosebleeds, sinus pressure, sore throat, tinnitus, trouble swallowing and voice change.   Eyes: Negative for photophobia, pain, redness and visual disturbance.  Respiratory: Negative for cough, chest tightness and shortness of breath.   Cardiovascular: Negative for chest pain, palpitations and leg swelling.  Gastrointestinal: Negative for nausea, vomiting, abdominal pain, diarrhea, constipation, blood in stool, abdominal distention and rectal pain.  Genitourinary: Negative for dysuria, urgency, frequency, hematuria, flank pain, vaginal bleeding, vaginal discharge, difficulty urinating, genital sores, vaginal pain, menstrual problem and pelvic pain.  Musculoskeletal: Negative for arthralgias, back pain and neck stiffness.  Skin: Negative for rash.  Neurological: Negative for dizziness, syncope, speech difficulty, weakness, light-headedness,  numbness and headaches.  Hematological: Negative for adenopathy. Does not bruise/bleed easily.  Psychiatric/Behavioral: Negative for suicidal ideas, behavioral problems, self-injury, dysphoric mood and agitation. The patient is not nervous/anxious.        Objective:   Physical Exam  Constitutional: She is oriented to person, place, and time. She appears well-developed and well-nourished.  HENT:  Head: Normocephalic and atraumatic.  Right Ear: External ear normal.  Left Ear: External ear normal.  Mouth/Throat: Oropharynx is clear and moist.  Eyes: Conjunctivae and EOM are normal.  Neck: Normal range of motion. Neck supple. No JVD present. No thyromegaly present.  Cardiovascular: Normal rate, regular rhythm, normal heart sounds and intact distal pulses.   No murmur heard. Pulmonary/Chest: Effort normal and breath sounds normal. She has no wheezes. She has no rales.  Abdominal: Soft. Bowel sounds are normal. She exhibits no distension and no mass. There is no tenderness. There is no rebound and no guarding.  Musculoskeletal: Normal range of motion. She exhibits no edema and no tenderness.  Neurological: She is alert and oriented to person, place, and time. She has normal reflexes. No cranial nerve deficit. She exhibits normal muscle tone. Coordination normal.  Skin: Skin is warm and dry. No rash noted.  Psychiatric: She has a normal mood and affect. Her behavior is normal.          Assessment & Plan:   Preventive health exam  We'll continue active lifestyle.  Patient is at ideal weight Return in one year or as needed Followup colonoscopy at age 63

## 2013-06-24 NOTE — Patient Instructions (Addendum)
It is important that you exercise regularly, at least 20 minutes 3 to 4 times per week.  If you develop chest pain or shortness of breath seek  medical attention.  Health Maintenance, Female A healthy lifestyle and preventative care can promote health and wellness.  Maintain regular health, dental, and eye exams.  Eat a healthy diet. Foods like vegetables, fruits, whole grains, low-fat dairy products, and lean protein foods contain the nutrients you need without too many calories. Decrease your intake of foods high in solid fats, added sugars, and salt. Get information about a proper diet from your caregiver, if necessary.  Regular physical exercise is one of the most important things you can do for your health. Most adults should get at least 150 minutes of moderate-intensity exercise (any activity that increases your heart rate and causes you to sweat) each week. In addition, most adults need muscle-strengthening exercises on 2 or more days a week.   Maintain a healthy weight. The body mass index (BMI) is a screening tool to identify possible weight problems. It provides an estimate of body fat based on height and weight. Your caregiver can help determine your BMI, and can help you achieve or maintain a healthy weight. For adults 20 years and older:  A BMI below 18.5 is considered underweight.  A BMI of 18.5 to 24.9 is normal.  A BMI of 25 to 29.9 is considered overweight.  A BMI of 30 and above is considered obese.  Maintain normal blood lipids and cholesterol by exercising and minimizing your intake of saturated fat. Eat a balanced diet with plenty of fruits and vegetables. Blood tests for lipids and cholesterol should begin at age 20 and be repeated every 5 years. If your lipid or cholesterol levels are high, you are over 50, or you are a high risk for heart disease, you may need your cholesterol levels checked more frequently.Ongoing high lipid and cholesterol levels should be treated  with medicines if diet and exercise are not effective.  If you smoke, find out from your caregiver how to quit. If you do not use tobacco, do not start.  Lung cancer screening is recommended for adults aged 55 80 years who are at high risk for developing lung cancer because of a history of smoking. Yearly low-dose computed tomography (CT) is recommended for people who have at least a 30-pack-year history of smoking and are a current smoker or have quit within the past 15 years. A pack year of smoking is smoking an average of 1 pack of cigarettes a day for 1 year (for example: 1 pack a day for 30 years or 2 packs a day for 15 years). Yearly screening should continue until the smoker has stopped smoking for at least 15 years. Yearly screening should also be stopped for people who develop a health problem that would prevent them from having lung cancer treatment.  If you are pregnant, do not drink alcohol. If you are breastfeeding, be very cautious about drinking alcohol. If you are not pregnant and choose to drink alcohol, do not exceed 1 drink per day. One drink is considered to be 12 ounces (355 mL) of beer, 5 ounces (148 mL) of wine, or 1.5 ounces (44 mL) of liquor.  Avoid use of street drugs. Do not share needles with anyone. Ask for help if you need support or instructions about stopping the use of drugs.  High blood pressure causes heart disease and increases the risk of stroke. Blood pressure should   be checked at least every 1 to 2 years. Ongoing high blood pressure should be treated with medicines, if weight loss and exercise are not effective.  If you are 55 to 63 years old, ask your caregiver if you should take aspirin to prevent strokes.  Diabetes screening involves taking a blood sample to check your fasting blood sugar level. This should be done once every 3 years, after age 45, if you are within normal weight and without risk factors for diabetes. Testing should be considered at a younger  age or be carried out more frequently if you are overweight and have at least 1 risk factor for diabetes.  Breast cancer screening is essential preventative care for women. You should practice "breast self-awareness." This means understanding the normal appearance and feel of your breasts and may include breast self-examination. Any changes detected, no matter how small, should be reported to a caregiver. Women in their 20s and 30s should have a clinical breast exam (CBE) by a caregiver as part of a regular health exam every 1 to 3 years. After age 40, women should have a CBE every year. Starting at age 40, women should consider having a mammogram (breast X-ray) every year. Women who have a family history of breast cancer should talk to their caregiver about genetic screening. Women at a high risk of breast cancer should talk to their caregiver about having an MRI and a mammogram every year.  Breast cancer gene (BRCA)-related cancer risk assessment is recommended for women who have family members with BRCA-related cancers. BRCA-related cancers include breast, ovarian, tubal, and peritoneal cancers. Having family members with these cancers may be associated with an increased risk for harmful changes (mutations) in the breast cancer genes BRCA1 and BRCA2. Results of the assessment will determine the need for genetic counseling and BRCA1 and BRCA2 testing.  The Pap test is a screening test for cervical cancer. Women should have a Pap test starting at age 21. Between ages 21 and 29, Pap tests should be repeated every 2 years. Beginning at age 30, you should have a Pap test every 3 years as long as the past 3 Pap tests have been normal. If you had a hysterectomy for a problem that was not cancer or a condition that could lead to cancer, then you no longer need Pap tests. If you are between ages 65 and 70, and you have had normal Pap tests going back 10 years, you no longer need Pap tests. If you have had past  treatment for cervical cancer or a condition that could lead to cancer, you need Pap tests and screening for cancer for at least 20 years after your treatment. If Pap tests have been discontinued, risk factors (such as a new sexual partner) need to be reassessed to determine if screening should be resumed. Some women have medical problems that increase the chance of getting cervical cancer. In these cases, your caregiver may recommend more frequent screening and Pap tests.  The human papillomavirus (HPV) test is an additional test that may be used for cervical cancer screening. The HPV test looks for the virus that can cause the cell changes on the cervix. The cells collected during the Pap test can be tested for HPV. The HPV test could be used to screen women aged 30 years and older, and should be used in women of any age who have unclear Pap test results. After the age of 30, women should have HPV testing at the same frequency   as a Pap test.  Colorectal cancer can be detected and often prevented. Most routine colorectal cancer screening begins at the age of 50 and continues through age 75. However, your caregiver may recommend screening at an earlier age if you have risk factors for colon cancer. On a yearly basis, your caregiver may provide home test kits to check for hidden blood in the stool. Use of a small camera at the end of a tube, to directly examine the colon (sigmoidoscopy or colonoscopy), can detect the earliest forms of colorectal cancer. Talk to your caregiver about this at age 50, when routine screening begins. Direct examination of the colon should be repeated every 5 to 10 years through age 75, unless early forms of pre-cancerous polyps or small growths are found.  Hepatitis C blood testing is recommended for all people born from 1945 through 1965 and any individual with known risks for hepatitis C.  Practice safe sex. Use condoms and avoid high-risk sexual practices to reduce the spread of  sexually transmitted infections (STIs). Sexually active women aged 25 and younger should be checked for Chlamydia, which is a common sexually transmitted infection. Older women with new or multiple partners should also be tested for Chlamydia. Testing for other STIs is recommended if you are sexually active and at increased risk.  Osteoporosis is a disease in which the bones lose minerals and strength with aging. This can result in serious bone fractures. The risk of osteoporosis can be identified using a bone density scan. Women ages 65 and over and women at risk for fractures or osteoporosis should discuss screening with their caregivers. Ask your caregiver whether you should be taking a calcium supplement or vitamin D to reduce the rate of osteoporosis.  Menopause can be associated with physical symptoms and risks. Hormone replacement therapy is available to decrease symptoms and risks. You should talk to your caregiver about whether hormone replacement therapy is right for you.  Use sunscreen. Apply sunscreen liberally and repeatedly throughout the day. You should seek shade when your shadow is shorter than you. Protect yourself by wearing long sleeves, pants, a wide-brimmed hat, and sunglasses year round, whenever you are outdoors.  Notify your caregiver of new moles or changes in moles, especially if there is a change in shape or color. Also notify your caregiver if a mole is larger than the size of a pencil eraser.  Stay current with your immunizations. Document Released: 08/26/2010 Document Revised: 06/07/2012 Document Reviewed: 08/26/2010 [employer] Patient Information 2014 [employer].  

## 2013-06-24 NOTE — Progress Notes (Signed)
Pre-visit discussion using our clinic review tool. No additional management support is needed unless otherwise documented below in the visit note.  

## 2014-11-29 ENCOUNTER — Encounter: Payer: Self-pay | Admitting: Family Medicine

## 2014-11-29 ENCOUNTER — Ambulatory Visit (INDEPENDENT_AMBULATORY_CARE_PROVIDER_SITE_OTHER): Payer: BLUE CROSS/BLUE SHIELD | Admitting: Family Medicine

## 2014-11-29 VITALS — BP 122/73 | HR 63 | Temp 98.4°F | Ht 63.0 in | Wt 118.0 lb

## 2014-11-29 DIAGNOSIS — N390 Urinary tract infection, site not specified: Secondary | ICD-10-CM

## 2014-11-29 DIAGNOSIS — R8290 Unspecified abnormal findings in urine: Secondary | ICD-10-CM | POA: Diagnosis not present

## 2014-11-29 LAB — POCT URINALYSIS DIPSTICK
BILIRUBIN UA: NEGATIVE
GLUCOSE UA: NEGATIVE
Ketones, UA: NEGATIVE
Leukocytes, UA: NEGATIVE
Nitrite, UA: NEGATIVE
Protein, UA: NEGATIVE
Urobilinogen, UA: 0.2
pH, UA: 6

## 2014-11-29 MED ORDER — CIPROFLOXACIN HCL 500 MG PO TABS: 500.0000 mg | ORAL_TABLET | Freq: Two times a day (BID) | ORAL | Status: DC

## 2014-11-29 NOTE — Progress Notes (Signed)
Pre visit review using our clinic review tool, if applicable. No additional management support is needed unless otherwise documented below in the visit note. 

## 2014-11-29 NOTE — Progress Notes (Signed)
   Subjective:    Patient ID: Michelle Cook, female    DOB: 1950-11-04, 64 y.o.   MRN: [id number]  HPI Here for 4 days of urgency to urinate and a foul odor to the urine. No fever or burning.    Review of Systems  Constitutional: Negative.   Respiratory: Negative.   Cardiovascular: Negative.   Gastrointestinal: Negative.   Genitourinary: Positive for urgency and frequency. Negative for dysuria, flank pain and pelvic pain.       Objective:   Physical Exam  Constitutional: She appears well-developed and well-nourished.  Cardiovascular: Normal rate, regular rhythm, normal heart sounds and intact distal pulses.   Pulmonary/Chest: Effort normal and breath sounds normal.  Abdominal: Soft. Bowel sounds are normal. She exhibits no distension and no mass. There is no tenderness. There is no rebound and no guarding.          Assessment & Plan:  UTI, treat with Cipro. Culture the sample

## 2014-12-01 LAB — URINE CULTURE: Colony Count: >=100000

## 2015-05-29 ENCOUNTER — Ambulatory Visit (INDEPENDENT_AMBULATORY_CARE_PROVIDER_SITE_OTHER): Payer: BLUE CROSS/BLUE SHIELD | Admitting: Family Medicine

## 2015-05-29 VITALS — BP 126/72 | HR 60 | Temp 97.7°F | Resp 16 | Ht 64.0 in | Wt 121.0 lb

## 2015-05-29 DIAGNOSIS — R350 Frequency of micturition: Secondary | ICD-10-CM | POA: Diagnosis not present

## 2015-05-29 DIAGNOSIS — N3001 Acute cystitis with hematuria: Secondary | ICD-10-CM | POA: Diagnosis not present

## 2015-05-29 LAB — POCT URINALYSIS DIP (MANUAL ENTRY)
BILIRUBIN UA: NEGATIVE
GLUCOSE UA: NEGATIVE
Ketones, POC UA: NEGATIVE
NITRITE UA: POSITIVE — AB
Spec Grav, UA: 1.015
Urobilinogen, UA: 0.2
pH, UA: 5.5

## 2015-05-29 LAB — POC MICROSCOPIC URINALYSIS (UMFC): MUCUS RE: ABSENT

## 2015-05-29 MED ORDER — NITROFURANTOIN MONOHYD MACRO 100 MG PO CAPS: 100.0000 mg | ORAL_CAPSULE | Freq: Two times a day (BID) | ORAL | Status: DC

## 2015-05-29 MED ORDER — PHENAZOPYRIDINE HCL 200 MG PO TABS: 200.0000 mg | ORAL_TABLET | Freq: Three times a day (TID) | ORAL | Status: DC | PRN

## 2015-05-29 NOTE — Progress Notes (Signed)
Subjective:  By signing my name below, I, Essence Howell, attest that this documentation has been prepared under the direction and in the presence of Norberto Sorenson, MD Electronically Signed: Charline Bills, ED Scribe 05/29/2015 at 9:04 AM.   Patient ID: Michelle Cook, female    DOB: 20-Oct-1950, 65 y.o.   MRN: [id number]  Chief Complaint  Patient presents with  . Urinary Frequency    since yesterday   HPI HPI Comments: Michelle Cook is a 65 y.o. female who presents to the [facility] complaining of urinary frequency for the past 3-4 days. Pt reports associated symptoms of urinary urgency, urinary retention, dysuria. No treatments tried PTA. She denies fever, chills, flank pain, vaginal discharge, nausea, vomiting, changes in bowels. Pt is seen approximately once a year for UTI. The last several have been pansensitive E.coli. Allergy to sulfa antibiotics.   History reviewed. No pertinent past medical history. Current Outpatient Prescriptions on File Prior to Visit  Medication Sig Dispense Refill  . Probiotic Product (PROBIOTIC DAILY) CAPS Take 1 each by mouth daily.    Marland Kitchen ibuprofen (ADVIL,MOTRIN) 200 MG tablet Take 400 mg by mouth every 6 (six) hours as needed for pain. Reported on 05/29/2015     No current facility-administered medications on file prior to visit.   Allergies  Allergen Reactions  . Sulfamethoxazole-Trimethoprim   . Sulfa Antibiotics Rash   Review of Systems  Constitutional: Negative for fever, chills, activity change and appetite change.  Gastrointestinal: Negative for nausea, vomiting, abdominal pain, diarrhea and constipation.  Genitourinary: Positive for dysuria, urgency and frequency. Negative for hematuria, flank pain, vaginal bleeding, vaginal discharge, enuresis, difficulty urinating and pelvic pain.  Psychiatric/Behavioral: Positive for sleep disturbance.  BP 126/72 mmHg  Pulse 60  Temp(Src) 97.7 F (36.5 C)  Resp 16  Ht  (1.626  m)  Wt 121 lb (54.885 kg)  BMI 20.76 kg/m2  SpO2 98%    Objective:   Physical Exam  Constitutional: She is oriented to person, place, and time. She appears well-developed and well-nourished. No distress.  HENT:  Head: Normocephalic and atraumatic.  Eyes: Conjunctivae and EOM are normal.  Neck: Neck supple. No tracheal deviation present.  Cardiovascular: Normal rate, regular rhythm and normal heart sounds.   Pulmonary/Chest: Effort normal. No respiratory distress.  Excellent air movement.   Abdominal: Soft. She exhibits no distension. There is no tenderness.  Positive bowel sounds.   Musculoskeletal: Normal range of motion.  No flank tenderness.  Neurological: She is alert and oriented to person, place, and time.  Skin: Skin is warm and dry.  Psychiatric: She has a normal mood and affect. Her behavior is normal.  Nursing note and vitals reviewed.  Results for orders placed or performed in visit on 05/29/15  POCT Microscopic Urinalysis ([facility])  Result Value Ref Range   WBC,UR,HPF,POC Too numerous to count  (A) None WBC/hpf   RBC,UR,HPF,POC Few (A) None RBC/hpf   Bacteria Many (A) None, Too numerous to count   Mucus Absent Absent   Epithelial Cells, UR Per Microscopy Few (A) None, Too numerous to count cells/hpf  POCT urinalysis dipstick  Result Value Ref Range   Color, UA yellow yellow   Clarity, UA cloudy (A) clear   Glucose, UA negative negative   Bilirubin, UA negative negative   Ketones, POC UA negative negative   Spec Grav, UA 1.015    Blood, UA moderate (A) negative   pH, UA 5.5    Protein Ur,  POC trace (A) negative   Urobilinogen, UA 0.2    Nitrite, UA Positive (A) Negative   Leukocytes, UA large (3+) (A) Negative      Assessment & Plan:   1. Urinary frequency   2. Acute cystitis with hematuria     Orders Placed This Encounter  Procedures  . Urine culture  . Care order/instruction    AVS printed - let patient go!  Marland Kitchen. POCT Microscopic Urinalysis ([facility])  .  POCT urinalysis dipstick    Meds ordered this encounter  Medications  . nitrofurantoin, macrocrystal-monohydrate, (MACROBID) 100 MG capsule    Sig: Take 1 capsule (100 mg total) by mouth 2 (two) times daily.    Dispense:  14 capsule    Refill:  0  . phenazopyridine (PYRIDIUM) 200 MG tablet    Sig: Take 1 tablet (200 mg total) by mouth 3 (three) times daily as needed for pain.    Dispense:  10 tablet    Refill:  0    I personally performed the services described in this documentation, which was scribed in my presence. The recorded information has been reviewed and considered, and addended by me as needed.  Norberto SorensonEva Philo Kurtz, MD MPH

## 2015-05-29 NOTE — Patient Instructions (Addendum)
   IF you received an x-ray today, you will receive an invoice from [facility]. Please contact [facility] at [phone] with questions or concerns regarding your invoice.   IF you received labwork today, you will receive an invoice from [facility]. Please contact [facility] at [phone] with questions or concerns regarding your invoice.   Our billing staff will not be able to assist you with questions regarding bills from these companies.  You will be contacted with the lab results as soon as they are available. The fastest way to get your results is to activate your My Chart account. Instructions are located on the last page of this paperwork. If you have not heard from us regarding the results in 2 weeks, please contact this office.     Urinary Tract Infection Urinary tract infections (UTIs) can develop anywhere along your urinary tract. Your urinary tract is your body's drainage system for removing wastes and extra water. Your urinary tract includes two kidneys, two ureters, a bladder, and a urethra. Your kidneys are a pair of bean-shaped organs. Each kidney is about the size of your fist. They are located below your ribs, one on each side of your spine. CAUSES Infections are caused by microbes, which are microscopic organisms, including fungi, viruses, and bacteria. These organisms are so small that they can only be seen through a microscope. Bacteria are the microbes that most commonly cause UTIs. SYMPTOMS  Symptoms of UTIs may vary by age and gender of the patient and by the location of the infection. Symptoms in young women typically include a frequent and intense urge to urinate and a painful, burning feeling in the bladder or urethra during urination. Older women and men are more likely to be tired, shaky, and weak and have muscle aches and abdominal pain. A fever may mean the infection is in your kidneys. Other symptoms of a kidney  infection include pain in your back or sides below the ribs, nausea, and vomiting. DIAGNOSIS To diagnose a UTI, your caregiver will ask you about your symptoms. Your caregiver will also ask you to provide a urine sample. The urine sample will be tested for bacteria and white blood cells. White blood cells are made by your body to help fight infection. TREATMENT  Typically, UTIs can be treated with medication. Because most UTIs are caused by a bacterial infection, they usually can be treated with the use of antibiotics. The choice of antibiotic and length of treatment depend on your symptoms and the type of bacteria causing your infection. HOME CARE INSTRUCTIONS  If you were prescribed antibiotics, take them exactly as your caregiver instructs you. Finish the medication even if you feel better after you have only taken some of the medication.  Drink enough water and fluids to keep your urine clear or pale yellow.  Avoid caffeine, tea, and carbonated beverages. They tend to irritate your bladder.  Empty your bladder often. Avoid holding urine for long periods of time.  Empty your bladder before and after sexual intercourse.  After a bowel movement, women should cleanse from front to back. Use each tissue only once. SEEK MEDICAL CARE IF:   You have back pain.  You develop a fever.  Your symptoms do not begin to resolve within 3 days. SEEK IMMEDIATE MEDICAL CARE IF:   You have severe back pain or lower abdominal pain.  You develop chills.  You have nausea or vomiting.  You have continued burning or discomfort with urination.   MAKE SURE YOU:   Understand these instructions.  Will watch your condition.  Will get help right away if you are not doing well or get worse.   This information is not intended to replace advice given to you by your health care provider. Make sure you discuss any questions you have with your health care provider.   Document Released: 11/20/2004 Document  Revised: 11/01/2014 Document Reviewed: 03/21/2011 Elsevier Interactive Patient Education 2016 [employer]  

## 2015-05-31 LAB — URINE CULTURE

## 2016-03-03 ENCOUNTER — Telehealth: Payer: Self-pay | Admitting: Internal Medicine

## 2016-03-03 NOTE — Telephone Encounter (Signed)
okay

## 2016-03-03 NOTE — Telephone Encounter (Signed)
Self.   Pt called in to switch pcp's she says that she changed insurance and was told by carrier that her current provider isn't in network. Pt would like to switch to Dr. Carver Filaalone.    Is this switch okay?

## 2016-03-04 NOTE — Telephone Encounter (Signed)
Ok with me 

## 2016-03-05 NOTE — Telephone Encounter (Signed)
Got patient scheduled

## 2016-03-11 ENCOUNTER — Ambulatory Visit (INDEPENDENT_AMBULATORY_CARE_PROVIDER_SITE_OTHER): Payer: Medicare Other | Admitting: Family

## 2016-03-11 ENCOUNTER — Encounter: Payer: Self-pay | Admitting: Family

## 2016-03-11 VITALS — BP 130/80 | HR 66 | Temp 97.7°F | Resp 16 | Ht 64.0 in | Wt 123.0 lb

## 2016-03-11 DIAGNOSIS — Z0000 Encounter for general adult medical examination without abnormal findings: Secondary | ICD-10-CM | POA: Diagnosis not present

## 2016-03-11 DIAGNOSIS — Z1231 Encounter for screening mammogram for malignant neoplasm of breast: Secondary | ICD-10-CM

## 2016-03-11 DIAGNOSIS — Z124 Encounter for screening for malignant neoplasm of cervix: Secondary | ICD-10-CM

## 2016-03-11 DIAGNOSIS — Z23 Encounter for immunization: Secondary | ICD-10-CM | POA: Diagnosis not present

## 2016-03-11 NOTE — Assessment & Plan Note (Signed)
Reviewed and updated patient's medical, surgical, family and social history. Medications and allergies were also reviewed. Basic screenings for depression, activities of daily living, hearing, cognition and safety were performed. Provider list was updated and health plan was provided to the patient.  

## 2016-03-11 NOTE — Assessment & Plan Note (Signed)
1) Anticipatory Guidance: Discussed importance of wearing a seatbelt while driving and not texting while driving; changing batteries in smoke detector at least once annually; wearing suntan lotion when outside; eating a balanced and moderate diet; getting physical activity at least 30 minutes per day.  2) Immunizations / Screenings / Labs:  Prevnar updated today. Declines Zostavax and influenza. All other immunizations are up-to-date per recommendations. Due for breast cancer screening with order placed for mammogram. Declines colonoscopy for colon cancer screening at this time. Recommended to check on Cologuard. Due for cervical cancer screening with referral to gynecology placed for well woman exam. Declines hepatitis C screening. Obtain vitamin D for vitamin D deficiency screening. Declines DEXA. All other screenings are up-to-date per recommendations. Obtain CBC, CMET, and lipid profile.    Overall well exam with risk factors for cardiovascular disease being minimal at this time. She exercises regularly and eats a balanced nutritional intake. She continues to work part-time as she is officially retired. Continue healthy lifestyle behaviors and choices. Follow-up prevention exam in 1 year. Follow-up office visit pending blood work as needed.

## 2016-03-11 NOTE — Progress Notes (Signed)
Subjective:    Patient ID: Michelle Cook, female    DOB: 1950-03-27, 66 y.o.   MRN: [id number]  Chief Complaint  Patient presents with  . Establish Care    CPE     HPI:  Michelle Cook is a 66 y.o. female who presents today for a Medicare Annual Wellness/Physical exam.    1) Health Maintenance -   Diet - Averages about 3 meals per day and snacks consisting of a regular diet; Caffeine intake a couple of times per week.   Exercise - No structured exercise.   2) Preventative Exams / Immunizations:  Dental -- Up to date  Vision -- Up to date.    Health Maintenance  Topic Date Due  . HIV Screening  07/19/1965  . PAP SMEAR  07/20/1971  . COLONOSCOPY  07/19/2000  . MAMMOGRAM  06/22/2010  . PNA vac Low Risk Adult (1 of 2 - PCV13) 07/20/2015  . INFLUENZA VACCINE  05/24/2016 (Originally 09/25/2015)  . DEXA SCAN  02/24/2017 (Originally 07/20/2015)  . ZOSTAVAX  02/24/2017 (Originally 07/20/2010)  . Hepatitis C Screening  02/24/2017 (Originally 26-Apr-1950)  . TETANUS/TDAP  12/03/2018     Immunization History  Administered Date(s) Administered  . Pneumococcal Conjugate-13 03/11/2016  . Td 12/02/2008    RISK FACTORS  Tobacco History  Smoking Status  . Never Smoker  Smokeless Tobacco  . Never Used     Cardiac risk factors: advanced age (older than 73 for men, 37 for women).  Depression Screen  Depression screen [facility] 2/9 05/29/2015  Decreased Interest 0  Down, Depressed, Hopeless 0  PHQ - 2 Score 0     Activities of Daily Living In your present state of health, do you have any difficulty performing the following activities?:  Driving? No Managing money?  No Feeding yourself? No Getting from bed to chair? No Climbing a flight of stairs? No Preparing food and eating?: No Bathing or showering? No Getting dressed: No Getting to the toilet? No Using the toilet: No Moving around from place to place: No In the past year have you fallen or had a near  fall?:No   Home Safety Has smoke detector and wears seat belts. No firearms. No excess sun exposure. Are there smokers in your home (other than you)?  No Do you feel safe at home?  Yes  Hearing Difficulties: No Do you often ask people to speak up or repeat themselves? No Do you experience ringing or noises in your ears? No  Do you have difficulty understanding soft or whispered voices? No    Cognitive Testing  Alert? Yes   Normal Appearance? Yes  Oriented to person? Yes  Place? Yes   Time? Yes  Recall of three objects?  Yes  Can perform simple calculations? Yes  Displays appropriate judgment? Yes  Can read the correct time from a watch face? Yes  Do you feel that you have a problem with memory? No  Do you often misplace items? No   Advanced Directives have been discussed with the patient? Yes   Current Physicians/Providers and Suppliers  1. Marcos Eke, FNP - Internal Medicine  Indicate any recent Medical Services you may have received from other than [facility] providers in the past year (date may be approximate).  All answers were reviewed with the patient and necessary referrals were made:  Jeanine Luz, FNP   03/11/2016    Allergies  Allergen Reactions  . Sulfamethoxazole-Trimethoprim   . Sulfa Antibiotics Rash  Outpatient Medications Prior to Visit  Medication Sig Dispense Refill  . Probiotic Product (PROBIOTIC DAILY) CAPS Take 1 each by mouth daily.    Marland Kitchen. ibuprofen (ADVIL,MOTRIN) 200 MG tablet Take 400 mg by mouth every 6 (six) hours as needed for pain. Reported on 05/29/2015    . nitrofurantoin, macrocrystal-monohydrate, (MACROBID) 100 MG capsule Take 1 capsule (100 mg total) by mouth 2 (two) times daily. 14 capsule 0  . phenazopyridine (PYRIDIUM) 200 MG tablet Take 1 tablet (200 mg total) by mouth 3 (three) times daily as needed for pain. 10 tablet 0   No facility-administered medications prior to visit.      Past Medical History:  Diagnosis Date  .  Chicken pox      Past Surgical History:  Procedure Laterality Date  . TUBAL LIGATION       Family History  Problem Relation Age of Onset  . COPD Mother   . Heart failure Mother   . Hypertension Mother   . Hypertension Sister   . Hypertension Brother      Social History   Social History  . Marital status: Single    Spouse name: N/A  . Number of children: 2  . Years of education: 12   Occupational History  . Not on file.   Social History Main Topics  . Smoking status: Never Smoker  . Smokeless tobacco: Never Used  . Alcohol use 0.0 oz/week     Comment: occasionally  . Drug use: No  . Sexual activity: Not on file   Other Topics Concern  . Not on file   Social History Narrative   Fun: [employer] tennis, [employer]   Denies abuse and feels safe at home.    Looking work part time.    Bake cakes.      Review of Systems  Constitutional: Denies fever, chills, fatigue, or significant weight gain/loss. HENT: Head: Denies headache or neck pain Ears: Denies changes in hearing, ringing in ears, earache, drainage Nose: Denies discharge, stuffiness, itching, nosebleed, sinus pain Throat: Denies sore throat, hoarseness, dry mouth, sores, thrush Eyes: Denies loss/changes in vision, pain, redness, blurry/double vision, flashing lights Cardiovascular: Denies chest pain/discomfort, tightness, palpitations, shortness of breath with activity, difficulty lying down, swelling, sudden awakening with shortness of breath Respiratory: Denies shortness of breath, cough, sputum production, wheezing Gastrointestinal: Denies dysphasia, heartburn, change in appetite, nausea, change in bowel habits, rectal bleeding, constipation, diarrhea, yellow skin or eyes Genitourinary: Denies frequency, urgency, burning/pain, blood in urine, incontinence, change in urinary strength. Musculoskeletal: Denies muscle/joint pain, stiffness, back pain, redness or swelling of joints, trauma Skin: Denies rashes,  lumps, itching, dryness, color changes, or hair/nail changes Neurological: Denies dizziness, fainting, seizures, weakness, numbness, tingling, tremor Psychiatric - Denies nervousness, stress, depression or memory loss Endocrine: Denies heat or cold intolerance, sweating, frequent urination, excessive thirst, changes in appetite Hematologic: Denies ease of bruising or bleeding    Objective:     BP 130/80 (BP Location: Left Arm, Patient Position: Sitting, Cuff Size: Normal)   Pulse 66   Temp 97.7 F (36.5 C) (Oral)   Resp 16   Ht 5\' 4"  (1.626 m)   Wt 123 lb (55.8 kg)   SpO2 97%   BMI 21.11 kg/m  Nursing note and vital signs reviewed.   Visual Acuity Screening   Right eye Left eye Both eyes  Without correction:     With correction: 20/20 20/20 20/20    Physical Exam  Constitutional: She is oriented to person, place, and time.  She appears well-developed and well-nourished.  HENT:  Head: Normocephalic.  Right Ear: Hearing, tympanic membrane, external ear and ear canal normal.  Left Ear: Hearing, tympanic membrane, external ear and ear canal normal.  Nose: Nose normal.  Mouth/Throat: Uvula is midline, oropharynx is clear and moist and mucous membranes are normal.  Eyes: Conjunctivae and EOM are normal. Pupils are equal, round, and reactive to light.  Neck: Neck supple. No JVD present. No tracheal deviation present. No thyromegaly present.  Cardiovascular: Normal rate, regular rhythm, normal heart sounds and intact distal pulses.   Pulmonary/Chest: Effort normal and breath sounds normal.  Abdominal: Soft. Bowel sounds are normal. She exhibits no distension and no mass. There is no tenderness. There is no rebound and no guarding.  Musculoskeletal: Normal range of motion. She exhibits no edema or tenderness.  Lymphadenopathy:    She has no cervical adenopathy.  Neurological: She is alert and oriented to person, place, and time. She has normal reflexes. No cranial nerve deficit. She  exhibits normal muscle tone. Coordination normal.  Skin: Skin is warm and dry.  Psychiatric: She has a normal mood and affect. Her behavior is normal. Judgment and thought content normal.       Assessment & Plan:   During the course of the visit the patient was educated and counseled about appropriate screening and preventive services including:    Pneumococcal vaccine   Influenza vaccine  Colorectal cancer screening  Diabetes screening  Nutrition counseling   Diet review for nutrition referral? Yes ____  Not Indicated _X___   Patient Instructions (the written plan) was given to the patient.  Medicare Attestation I have personally reviewed: The patient's medical and social history Their use of alcohol, tobacco or illicit drugs Their current medications and supplements The patient's functional ability including ADLs,fall risks, home safety risks, cognitive, and hearing and visual impairment Diet and physical activities Evidence for depression or mood disorders  The patient's weight, height, BMI,  have been recorded in the chart.  I have made referrals, counseling, and provided education to the patient based on review of the above and I have provided the patient with a written personalized care plan for preventive services.     Problem List Items Addressed This Visit      Other   Medicare annual wellness visit, initial - Primary    Reviewed and updated patient's medical, surgical, family and social history. Medications and allergies were also reviewed. Basic screenings for depression, activities of daily living, hearing, cognition and safety were performed. Provider list was updated and health plan was provided to the patient.        Routine general medical examination at a health care facility    1) Anticipatory Guidance: Discussed importance of wearing a seatbelt while driving and not texting while driving; changing batteries in smoke detector at least once annually;  wearing suntan lotion when outside; eating a balanced and moderate diet; getting physical activity at least 30 minutes per day.  2) Immunizations / Screenings / Labs:  Prevnar updated today. Declines Zostavax and influenza. All other immunizations are up-to-date per recommendations. Due for breast cancer screening with order placed for mammogram. Declines colonoscopy for colon cancer screening at this time. Recommended to check on Cologuard. Due for cervical cancer screening with referral to gynecology placed for well woman exam. Declines hepatitis C screening. Obtain vitamin D for vitamin D deficiency screening. Declines DEXA. All other screenings are up-to-date per recommendations. Obtain CBC, CMET, and lipid profile.  Overall well exam with risk factors for cardiovascular disease being minimal at this time. She exercises regularly and eats a balanced nutritional intake. She continues to work part-time as she is officially retired. Continue healthy lifestyle behaviors and choices. Follow-up prevention exam in 1 year. Follow-up office visit pending blood work as needed.       Relevant Orders   CBC   Comprehensive metabolic panel   Lipid panel   VITAMIN D 25 Hydroxy (Vit-D Deficiency, Fractures)    Other Visit Diagnoses    Cervical cancer screening       Relevant Orders   Ambulatory referral to Gynecology   Encounter for screening mammogram for breast cancer       Relevant Orders   MM DIGITAL SCREENING BILATERAL   Need for vaccination with 13-polyvalent pneumococcal conjugate vaccine       Relevant Orders   Pneumococcal conjugate vaccine 13-valent IM (Completed)       I have discontinued Ms. Duffner's ibuprofen, nitrofurantoin (macrocrystal-monohydrate), and phenazopyridine. I am also having her maintain her PROBIOTIC DAILY.   Follow-up: Return in about 1 year (around 03/11/2017), or if symptoms worsen or fail to improve.   Jeanine Luz, FNP

## 2016-03-11 NOTE — Patient Instructions (Signed)
Thank you for choosing [facility].  SUMMARY AND INSTRUCTIONS:  Labs:  Please stop by the lab on the lower level of the building for your blood work. Your results will be released to [place] (or called to you) after review, usually within 72 hours after test completion. If any changes need to be made, you will be notified at that same time.  1.) The lab is open from 7:30am to 5:30 pm Monday-Friday 2.) No appointment is necessary 3.) Fasting (if needed) is 6-8 hours after food and drink; black coffee and water are okay   Referrals:  They will call with your appointment for gynecology and your mammogram.   Referrals have been made during this visit. You should expect to hear back from our schedulers in about 7-10 days in regards to establishing an appointment with the specialists we discussed.   Follow up:  If your symptoms worsen or fail to improve, please contact our office for further instruction, or in case of emergency go directly to the emergency room at the closest medical facility.   Health Maintenance  Topic Date Due  . Hepatitis C Screening  [birth year]  . HIV Screening  07/19/64  . PAP SMEAR  07/19/64  . COLONOSCOPY  07/19/64  . MAMMOGRAM  06/21/64  . ZOSTAVAX  07/19/64  . DEXA SCAN  07/20/2015  . PNA vac Low Risk Adult (1 of 2 - PCV13) 07/19/64  . INFLUENZA VACCINE  05/24/64 (Originally 09/24/64)  . TETANUS/TDAP  12/02/64   Health Maintenance, Female Introduction Adopting a healthy lifestyle and getting preventive care can go a long way to promote health and wellness. Talk with your health care provider about what schedule of regular examinations is right for you. This is a good chance for you to check in with your provider about disease prevention and staying healthy. In between checkups, there are plenty of things you can do on your own. Experts have done a lot of research about which lifestyle changes and preventive measures are most likely to  keep you healthy. Ask your health care provider for more information. Weight and diet Eat a healthy diet  Be sure to include plenty of vegetables, fruits, low-fat dairy products, and lean protein.  Do not eat a lot of foods high in solid fats, added sugars, or salt.  Get regular exercise. This is one of the most important things you can do for your health.  Most adults should exercise for at least 150 minutes each week. The exercise should increase your heart rate and make you sweat (moderate-intensity exercise).  Most adults should also do strengthening exercises at least twice a week. This is in addition to the moderate-intensity exercise. Maintain a healthy weight  Body mass index (BMI) is a measurement that can be used to identify possible weight problems. It estimates body fat based on height and weight. Your health care provider can help determine your BMI and help you achieve or maintain a healthy weight.  For females 16 years of age and older:  A BMI below 18.5 is considered underweight.  A BMI of 18.5 to 24.9 is normal.  A BMI of 25 to 29.9 is considered overweight.  A BMI of 30 and above is considered obese. Watch levels of cholesterol and blood lipids  You should start having your blood tested for lipids and cholesterol at 66 years of age, then have this test every 5 years.  You may need to have your cholesterol levels checked more often if:  Your lipid or cholesterol levels are high.  You are older than 66 years of age.  You are at high risk for heart disease. Cancer screening Lung Cancer  Lung cancer screening is recommended for adults 66-24 years old who are at high risk for lung cancer because of a history of smoking.  A yearly low-dose CT scan of the lungs is recommended for people who:  Currently smoke.  Have quit within the past 15 years.  Have at least a 30-pack-year history of smoking. A pack year is smoking an average of one pack of cigarettes a  day for 1 year.  Yearly screening should continue until it has been 15 years since you quit.  Yearly screening should stop if you develop a health problem that would prevent you from having lung cancer treatment. Breast Cancer  Practice breast self-awareness. This means understanding how your breasts normally appear and feel.  It also means doing regular breast self-exams. Let your health care provider know about any changes, no matter how small.  If you are in your 66 or 30s, you should have a clinical breast exam (CBE) by a health care provider every 1-3 years as part of a regular health exam.  If you are 66 or older, have a CBE every year. Also consider having a breast X-ray (mammogram) every year.  If you have a family history of breast cancer, talk to your health care provider about genetic screening.  If you are at high risk for breast cancer, talk to your health care provider about having an MRI and a mammogram every year.  Breast cancer gene (BRCA) assessment is recommended for women who have family members with BRCA-related cancers. BRCA-related cancers include:  Breast.  Ovarian.  Tubal.  Peritoneal cancers.  Results of the assessment will determine the need for genetic counseling and BRCA1 and BRCA2 testing. Cervical Cancer  Your health care provider may recommend that you be screened regularly for cancer of the pelvic organs (ovaries, uterus, and vagina). This screening involves a pelvic examination, including checking for microscopic changes to the surface of your cervix (Pap test). You may be encouraged to have this screening done every 3 years, beginning at age 66.  For women ages 66-65, health care providers may recommend pelvic exams and Pap testing every 3 years, or they may recommend the Pap and pelvic exam, combined with testing for human papilloma virus (HPV), every 5 years. Some types of HPV increase your risk of cervical cancer. Testing for HPV may also be  done on women of any age with unclear Pap test results.  Other health care providers may not recommend any screening for nonpregnant women who are considered low risk for pelvic cancer and who do not have symptoms. Ask your health care provider if a screening pelvic exam is right for you.  If you have had past treatment for cervical cancer or a condition that could lead to cancer, you need Pap tests and screening for cancer for at least 20 years after your treatment. If Pap tests have been discontinued, your risk factors (such as having a new sexual partner) need to be reassessed to determine if screening should resume. Some women have medical problems that increase the chance of getting cervical cancer. In these cases, your health care provider may recommend more frequent screening and Pap tests. Colorectal Cancer  This type of cancer can be detected and often prevented.  Routine colorectal cancer screening usually begins at 66 years of age and  continues through 66 years of age.  Your health care provider may recommend screening at an earlier age if you have risk factors for colon cancer.  Your health care provider may also recommend using home test kits to check for hidden blood in the stool.  A small camera at the end of a tube can be used to examine your colon directly (sigmoidoscopy or colonoscopy). This is done to check for the earliest forms of colorectal cancer.  Routine screening usually begins at age 18.  Direct examination of the colon should be repeated every 5-10 years through 66 years of age. However, you may need to be screened more often if early forms of precancerous polyps or small growths are found. Skin Cancer  Check your skin from head to toe regularly.  Tell your health care provider about any new moles or changes in moles, especially if there is a change in a mole's shape or color.  Also tell your health care provider if you have a mole that is larger than the size of  a pencil eraser.  Always use sunscreen. Apply sunscreen liberally and repeatedly throughout the day.  Protect yourself by wearing long sleeves, pants, a wide-brimmed hat, and sunglasses whenever you are outside. Heart disease, diabetes, and high blood pressure  High blood pressure causes heart disease and increases the risk of stroke. High blood pressure is more likely to develop in:  People who have blood pressure in the high end of the normal range (130-139/85-89 mm Hg).  People who are overweight or obese.  People who are African American.  If you are 55-63 years of age, have your blood pressure checked every 3-5 years. If you are 16 years of age or older, have your blood pressure checked every year. You should have your blood pressure measured twice-once when you are at a hospital or clinic, and once when you are not at a hospital or clinic. Record the average of the two measurements. To check your blood pressure when you are not at a hospital or clinic, you can use:  An automated blood pressure machine at a pharmacy.  A home blood pressure monitor.  If you are between 19 years and 58 years old, ask your health care provider if you should take aspirin to prevent strokes.  Have regular diabetes screenings. This involves taking a blood sample to check your fasting blood sugar level.  If you are at a normal weight and have a low risk for diabetes, have this test once every three years after 66 years of age.  If you are overweight and have a high risk for diabetes, consider being tested at a younger age or more often. Preventing infection Hepatitis B  If you have a higher risk for hepatitis B, you should be screened for this virus. You are considered at high risk for hepatitis B if:  You were born in a country where hepatitis B is common. Ask your health care provider which countries are considered high risk.  Your parents were born in a high-risk country, and you have not been  immunized against hepatitis B (hepatitis B vaccine).  You have HIV or AIDS.  You use needles to inject street drugs.  You live with someone who has hepatitis B.  You have had sex with someone who has hepatitis B.  You get hemodialysis treatment.  You take certain medicines for conditions, including cancer, organ transplantation, and autoimmune conditions. Hepatitis C  Blood testing is recommended for:  Everyone  born from [birth year] through 1965.  Anyone with known risk factors for hepatitis C. Sexually transmitted infections (STIs)  You should be screened for sexually transmitted infections (STIs) including gonorrhea and chlamydia if:  You are sexually active and are younger than 66 years of age.  You are older than 66 years of age and your health care provider tells you that you are at risk for this type of infection.  Your sexual activity has changed since you were last screened and you are at an increased risk for chlamydia or gonorrhea. Ask your health care provider if you are at risk.  If you do not have HIV, but are at risk, it may be recommended that you take a prescription medicine daily to prevent HIV infection. This is called pre-exposure prophylaxis (PrEP). You are considered at risk if:  You are sexually active and do not regularly use condoms or know the HIV status of your partner(s).  You take drugs by injection.  You are sexually active with a partner who has HIV. Talk with your health care provider about whether you are at high risk of being infected with HIV. If you choose to begin PrEP, you should first be tested for HIV. You should then be tested every 3 months for as long as you are taking PrEP. Pregnancy  If you are premenopausal and you may become pregnant, ask your health care provider about preconception counseling.  If you may become pregnant, take 400 to 800 micrograms (mcg) of folic acid every day.  If you want to prevent pregnancy, talk to your health  care provider about birth control (contraception). Osteoporosis and menopause  Osteoporosis is a disease in which the bones lose minerals and strength with aging. This can result in serious bone fractures. Your risk for osteoporosis can be identified using a bone density scan.  If you are 38 years of age or older, or if you are at risk for osteoporosis and fractures, ask your health care provider if you should be screened.  Ask your health care provider whether you should take a calcium or vitamin D supplement to lower your risk for osteoporosis.  Menopause may have certain physical symptoms and risks.  Hormone replacement therapy may reduce some of these symptoms and risks. Talk to your health care provider about whether hormone replacement therapy is right for you. Follow these instructions at home:  Schedule regular health, dental, and eye exams.  Stay current with your immunizations.  Do not use any tobacco products including cigarettes, chewing tobacco, or electronic cigarettes.  If you are pregnant, do not drink alcohol.  If you are breastfeeding, limit how much and how often you drink alcohol.  Limit alcohol intake to no more than 1 drink per day for nonpregnant women. One drink equals 12 ounces of beer, 5 ounces of wine, or 1 ounces of hard liquor.  Do not use street drugs.  Do not share needles.  Ask your health care provider for help if you need support or information about quitting drugs.  Tell your health care provider if you often feel depressed.  Tell your health care provider if you have ever been abused or do not feel safe at home. This information is not intended to replace advice given to you by your health care provider. Make sure you discuss any questions you have with your health care provider. Document Released: 08/26/2010 Document Revised: 07/19/2015 Document Reviewed: 11/14/2014  2017 [employer]vier

## 2016-03-18 ENCOUNTER — Other Ambulatory Visit (INDEPENDENT_AMBULATORY_CARE_PROVIDER_SITE_OTHER): Payer: Medicare Other

## 2016-03-18 DIAGNOSIS — Z0000 Encounter for general adult medical examination without abnormal findings: Secondary | ICD-10-CM | POA: Diagnosis not present

## 2016-03-18 LAB — COMPREHENSIVE METABOLIC PANEL
ALT: 14 U/L (ref 0–35)
AST: 18 U/L (ref 0–37)
Albumin: 4.1 g/dL (ref 3.5–5.2)
Alkaline Phosphatase: 74 U/L (ref 39–117)
BILIRUBIN TOTAL: 0.8 mg/dL (ref 0.2–1.2)
BUN: 17 mg/dL (ref 6–23)
CHLORIDE: 106 meq/L (ref 96–112)
CO2: 28 meq/L (ref 19–32)
CREATININE: 0.99 mg/dL (ref 0.40–1.20)
Calcium: 9.6 mg/dL (ref 8.4–10.5)
GFR: 59.71 mL/min — ABNORMAL LOW (ref >60.00)
Glucose, Bld: 93 mg/dL (ref 70–99)
Potassium: 4.3 mEq/L (ref 3.5–5.1)
SODIUM: 141 meq/L (ref 135–145)
Total Protein: 6.9 g/dL (ref 6.0–8.3)

## 2016-03-18 LAB — CBC
HCT: 40.4 % (ref 36.0–46.0)
Hemoglobin: 13.6 g/dL (ref 12.0–15.0)
MCHC: 33.6 g/dL (ref 30.0–36.0)
MCV: 84.4 fl (ref 78.0–100.0)
Platelets: 363 10*3/uL (ref 150.0–400.0)
RBC: 4.79 Mil/uL (ref 3.87–5.11)
RDW: 13.2 % (ref 11.5–15.5)
WBC: 4.4 10*3/uL (ref 4.0–10.5)

## 2016-03-18 LAB — LIPID PANEL
Cholesterol: 198 mg/dL (ref 0–200)
HDL: 50.9 mg/dL (ref >39.00)
LDL CALC: 130 mg/dL — AB (ref 0–99)
NONHDL: 146.89
Total CHOL/HDL Ratio: 4
Triglycerides: 83 mg/dL (ref 0.0–149.0)
VLDL: 16.6 mg/dL (ref 0.0–40.0)

## 2016-03-18 LAB — VITAMIN D 25 HYDROXY (VIT D DEFICIENCY, FRACTURES): VITD: 19.75 ng/mL — AB (ref 30.00–100.00)

## 2016-04-03 ENCOUNTER — Encounter: Payer: Self-pay | Admitting: Gynecology

## 2016-04-03 ENCOUNTER — Ambulatory Visit (INDEPENDENT_AMBULATORY_CARE_PROVIDER_SITE_OTHER): Payer: Medicare Other | Admitting: Gynecology

## 2016-04-03 VITALS — BP 120/74 | Ht 64.0 in | Wt 121.0 lb

## 2016-04-03 DIAGNOSIS — N952 Postmenopausal atrophic vaginitis: Secondary | ICD-10-CM

## 2016-04-03 DIAGNOSIS — Z01411 Encounter for gynecological examination (general) (routine) with abnormal findings: Secondary | ICD-10-CM

## 2016-04-03 DIAGNOSIS — Z124 Encounter for screening for malignant neoplasm of cervix: Secondary | ICD-10-CM

## 2016-04-03 DIAGNOSIS — N8111 Cystocele, midline: Secondary | ICD-10-CM

## 2016-04-03 NOTE — Addendum Note (Signed)
Addended by: Dayna BarkerGARDNER, Coriann Brouhard K on: 04/03/2016 11:35 AM   Modules accepted: Orders

## 2016-04-03 NOTE — Progress Notes (Signed)
    Michelle JamesCynthia L Cook 03-04-1950 [id number]        66 y.o.  G2P2 new patient for breast and pelvic exam. It has been a number of years since she has had a gynecologic exam  Past medical history,surgical history, problem list, medications, allergies, family history and social history were all reviewed and documented as reviewed in the EPIC chart.  ROS:  Performed with pertinent positives and negatives included in the history, assessment and plan.   Additional significant findings :  None   Exam: Kennon PortelaKim Gardner assistant Vitals:   04/03/16 1009  BP: 120/74  Weight: 121 lb (54.9 kg)  Height: 5\' 4"  (1.626 m)   Body mass index is 20.77 kg/m.  General appearance:  Normal affect, orientation and appearance. Skin: Grossly normal HEENT: Without gross lesions.  No cervical or supraclavicular adenopathy. Thyroid normal.  Lungs:  Clear without wheezing, rales or rhonchi Cardiac: RR, without RMG Abdominal:  Soft, nontender, without masses, guarding, rebound, organomegaly or hernia Breasts:  Examined lying and sitting without masses, retractions, discharge or axillary adenopathy. Pelvic:  Ext, BUS, Vagina with atrophic changes. First-degree cystocele noted  Cervix with atrophic changes. Pap smear done  Uterus anteverted, normal size, shape and contour, midline and mobile nontender   Adnexa without masses or tenderness    Anus and perineum normal   Rectovaginal normal sphincter tone without palpated masses or tenderness.    Assessment/Plan:  66 y.o. G2P2 female for breast and pelvic exam.   1. Postmenopausal/atrophic genital changes. No significant hot flushes, night sweats, vaginal dryness or any vaginal bleeding. Continue to monitor and report any issues with bleeding. 2. Cystocele. Patient has a first-degree cystocele. She remembers being told this with her last gynecologic exam. She has no symptoms such as pressure or protrusion. Occasional SUI incontinence not overly bothersome to the  patient. Kegel exercises reviewed with her. She'll follow up if she develops symptoms. 3. Mammography over 10 years ago. Need to schedule mammogram now discussed. Most common cancer in women reviewed. Patient agrees to do so. SBE monthly reviewed. 4. DEXA over 10 years ago historically. Schedule baseline DEXA now. She is at increased risk for bone loss given her Caucasian thin status. Patient agrees to schedule. 5. Colonoscopy never. Second-most common cancer in women reviewed. Patient is in the process of calling and scheduling this now. 6. Pap smear 2010. Pap smear done today. No history of abnormal Pap smears. 7. Health maintenance. No routine lab work done as patient does this elsewhere. Follow up for mammogram, colonoscopy, bone density. Follow up in one year for gynecologic exam. Follow up sooner if any issues.   Dara LordsFONTAINE,Tiffany Calmes P MD, 10:41 AM 04/03/2016

## 2016-04-03 NOTE — Patient Instructions (Signed)
Schedule and follow up for your bone density.  Scheduling follow up for your colonoscopy.  Schedule and follow up for your mammography.   Call to Schedule your mammogram  Facilities [place]: 1)  [facility], [phone] Phone: [phone])  [facility]  [address] Phone: [phone]     Mammogram A mammogram is an X-ray test to find changes in a woman's breast. You should get a mammogram if:  You are 66 years of age or older  You have risk factors.   Your doctor recommends that you have one.  BEFORE THE TEST  Do not schedule the test the week before your period, especially if your breasts are sore during this time.  On the day of your mammogram:  Wash your breasts and armpits well. After washing, do not put on any deodorant or talcum powder on until after your test.   Eat and drink as you usually do.   Take your medicines as usual.   If you are diabetic and take insulin, make sure you:   Eat before coming for your test.   Take your insulin as usual.   If you cannot keep your appointment, call before the appointment to cancel. Schedule another appointment.  TEST  You will need to undress from the waist up. You will put on a hospital gown.   Your breast will be put on the mammogram machine, and it will press firmly on your breast with a piece of plastic called a compression paddle. This will make your breast flatter so that the machine can X-ray all parts of your breast.   Both breasts will be X-rayed. Each breast will be X-rayed from above and from the side. An X-ray might need to be taken again if the picture is not good enough.   The mammogram will last about 15 to 30 minutes.  AFTER THE TEST Finding out the results of your test Ask when your test results will be ready. Make sure you get your test results.  Document Released: 05/09/2008 Document  Revised: 01/30/2011 Document Reviewed: 05/09/2008 [facility] Patient Information 2012 [place].

## 2016-04-04 LAB — PAP IG (IMAGE GUIDED)

## 2016-06-17 ENCOUNTER — Ambulatory Visit
Admission: RE | Admit: 2016-06-17 | Discharge: 2016-06-17 | Disposition: A | Discharge status: Home / Self Care | Payer: Medicare Other | Source: Ambulatory Visit | Attending: Family | Admitting: Family

## 2016-06-17 DIAGNOSIS — Z1231 Encounter for screening mammogram for malignant neoplasm of breast: Secondary | ICD-10-CM

## 2017-07-22 NOTE — Progress Notes (Signed)
[facility] - Clinic Note  07/23/2017     CHIEF COMPLAINT Patient presents for Retina Evaluation   HISTORY OF PRESENT ILLNESS: Michelle Cook is a 67 y.o. female who presents to the clinic today for:   HPI    Retina Evaluation    In left eye.  Associated Symptoms Negative for Flashes, Distortion, Pain, Photophobia, Trauma, Jaw Claudication, Fever, Fatigue, Weight Loss, Shoulder/Hip pain, Scalp Tenderness, Glare, Redness, Blind Spot and Floaters.  Context:  distance vision, mid-range vision and near vision.  Treatments tried include no treatments.  I, the attending physician,  performed the HPI with the patient and updated documentation appropriately.          Comments    Pt presents on the referral of Dr. Fabian Sharp for evaluation of ERM with edema OS; Pt states she sees Dr. Dione Booze on a routine basis; Pt states she went to see Dr. Dione Booze due to OU feeling very tired, pt states she was then told that she has "some swelling"; Pt denies floaters, denies flashes, denies wavy VA; Pt denies taking any ocular vits or gtts usage; Pt denies having any ocular surgeries or any other eye dx other than "needing glasses";        Last edited by Rennis Chris, MD on 07/23/2017  9:04 AM. (History)      Referring physician: Olivia Canter, MD [address] [place], [place] [address]  HISTORICAL INFORMATION:   Selected notes from the MEDICAL RECORD NUMBER Referred by Dr. Laruth Bouchard for concern of ERM w/ edema OS;  LEE- 05.14.19 (S. Groat) [BCVA OD: 20/25 OS: 20/25] Ocular Hx- cataract OU, hyperopia, presbyopia, ERM w/ edema OS, allergic conjunctivitis OU PMH-     CURRENT MEDICATIONS: No current outpatient medications on file. (Ophthalmic Drugs)   No current facility-administered medications for this visit.  (Ophthalmic Drugs)   Current Outpatient Medications (Other)  Medication Sig  . Cholecalciferol (VITAMIN D PO) Take by mouth.  . Probiotic Product (PROBIOTIC  DAILY) CAPS Take 1 each by mouth daily.   No current facility-administered medications for this visit.  (Other)      REVIEW OF SYSTEMS: ROS    Positive for: Eyes   Negative for: Constitutional, Gastrointestinal, Neurological, Skin, Genitourinary, Musculoskeletal, HENT, Endocrine, Cardiovascular, Respiratory, Psychiatric, Allergic/Imm, Heme/Lymph   Last edited by Concepcion Elk, COA on 07/23/2017  8:22 AM. (History)       ALLERGIES Allergies  Allergen Reactions  . Sulfamethoxazole-Trimethoprim   . Sulfa Antibiotics Rash    PAST MEDICAL HISTORY Past Medical History:  Diagnosis Date  . Chicken pox   . STD (sexually transmitted disease)    Herpes   Past Surgical History:  Procedure Laterality Date  . TUBAL LIGATION      FAMILY HISTORY Family History  Problem Relation Age of Onset  . COPD Mother   . Heart failure Mother   . Hypertension Mother   . Hypertension Sister   . Hypertension Brother     SOCIAL HISTORY Social History   Tobacco Use  . Smoking status: Never Smoker  . Smokeless tobacco: Never Used  Substance Use Topics  . Alcohol use: Yes    Alcohol/week: 0.0 oz    Comment: occasionally  . Drug use: No         OPHTHALMIC EXAM:  Base Eye Exam    Visual Acuity (Snellen - Linear)      Right Left   Dist cc 20/40 20/30  Dist ph cc 20/30 20/25   Correction:  Glasses       Tonometry (Tonopen, 8:41 AM)      Right Left   Pressure 15 13       Pupils      Dark Light Shape React APD   Right 4 3 Round Brisk None   Left 4 3 Round Brisk None       Visual Fields (Counting fingers)      Left Right    Full Full       Extraocular Movement      Right Left    Full, Ortho Full, Ortho       Neuro/Psych    Oriented x3:  Yes   Mood/Affect:  Normal       Dilation    Both eyes:  1.0% Mydriacyl, 2.5% Phenylephrine @ 8:41 AM        Slit Lamp and Fundus Exam    Slit Lamp Exam      Right Left   Lids/Lashes Dermatochalasis - upper lid  Dermatochalasis - upper lid   Conjunctiva/Sclera White and quiet White and quiet   Cornea Arcus, 1+ diffuse Punctate epithelial erosions Arcus, 1+ diffuse Punctate epithelial erosions   Anterior Chamber Deep and quiet Deep and quiet   Iris Round and well dilated Round and well dilated   Lens 2+ Nuclear sclerosis, 2-3+ Cortical cataract 2+ Nuclear sclerosis, 2-3+ Cortical cataract   Vitreous Vitreous syneresis Vitreous syneresis, Posterior vitreous detachment       Fundus Exam      Right Left   Disc Pink and Sharp Pink and Sharp   C/D Ratio 0.3 0.3   Macula Good foveal reflex, mild Retinal pigment epithelial mottling, No heme or edema Epiretinal membrane with striae/pucker, lamellar hole / pseudo hole with surrounding cystic change   Vessels Mild Vascular attenuation, AV crossing changes Mild Vascular attenuation, AV crossing changes   Periphery Attached Attached        Refraction    Wearing Rx      Sphere Cylinder Axis Add   Right +3.00 +1.00 164 +2.25   Left +3.00 +1.25 017 +2.25   Age:  30yrs   Type:  PAL       Manifest Refraction      Sphere Cylinder Axis Dist VA   Right +2.50 +1.00 164 20/25   Left +2.75 +1.25 017 20/25          IMAGING AND PROCEDURES  Imaging and Procedures for @  OCT, Retina - OU - Both Eyes       Right Eye Quality was good. Central Foveal Thickness: 231. Progression has no prior data. Findings include normal foveal contour, no IRF, no SRF.   Left Eye Central Foveal Thickness: 563. Progression has no prior data. Findings include abnormal foveal contour, epiretinal membrane, cystoid macular edema, no SRF.   Notes *Images captured and stored on drive  Diagnosis / Impression:  OD: NFP; no IRF/SRF OS: ERM w/ CME/foveoschisis  Clinical management:  See below  Abbreviations: NFP - Normal foveal profile. CME - cystoid macular edema. PED - pigment epithelial detachment. IRF - intraretinal fluid. SRF - subretinal fluid. EZ - ellipsoid  zone. ERM - epiretinal membrane. ORA - outer retinal atrophy. ORT - outer retinal tubulation. SRHM - subretinal hyper-reflective material        Fluorescein Angiography Optos (Transit OS)       Right Eye Progression has no prior data. Early phase findings include normal observations. Mid/Late  phase findings include normal observations.   Left Eye Progression has no prior data. Early phase findings include normal observations. Mid/Late phase findings include normal observations.   Notes Images stored on drive;   Impression: Normal study OU No CME / leakage OS                  ASSESSMENT/PLAN:    ICD-10-CM   1. 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 Cystoid macular edema of left eye H35.352 OCT, Retina - OU - Both Eyes    Fluorescein Angiography Optos (Transit OS)  3. Posterior vitreous detachment of left eye H43.812   4. Combined forms of age-related cataract of both eyes H25.813     1,2. Epiretinal membrane w/ pucker and pseudohole OS- The natural history, anatomy, potential for loss of vision, and treatment options including vitrectomy techniques and the complications of endophthalmitis, retinal detachment, vitreous hemorrhage, cataract progression and permanent vision loss discussed with the patient. +foveoschisis OS -- no CME/leakage on FA - asymptomatic; no metamorphopsia - BCVA 20/25 - recommend monitoring for now due to great VA and asymptomatic -- pt in agreement - F/U 3 months, DFE, OCT  3. PVD / vitreous syneresis OS  Discussed findings and prognosis  No RT or RD on 360 peripheral exam  Reviewed s/s of RT/RD  Strict return precautions for any such RT/RD signs/symptoms  4. Combined form age-related cataract OU-  - The symptoms of cataract, surgical options, and treatments and risks were discussed with patient. - discussed diagnosis and progression - not yet visually significant - monitor for now   Ophthalmic Meds Ordered this visit:   No orders of the defined types were placed in this encounter.      Return in about 3 months (around 10/23/2017) for Dilated Exam, OCT.  There are no Patient Instructions on file for this visit.   Explained the diagnoses, plan, and follow up with the patient and they expressed understanding.  Patient expressed understanding of the importance of proper follow up care.   This document serves as a record of services personally performed by Karie Chimera, MD, PhD. It was created on their behalf by Virgilio Belling, COA, a certified ophthalmic assistant. The creation of this record is the provider's dictation and/or activities during the visit.  Electronically signed by: Virgilio Belling, COA  05.29.19 1:16 PM    Karie Chimera, M.D., Ph.D. Diseases & Surgery of the Retina and Vitreous [facility] 05.30.19  I have reviewed the above documentation for accuracy and completeness, and I agree with the above. Karie Chimera, M.D., Ph.D. 07/23/17 1:16 PM     Abbreviations: M myopia (nearsighted); A astigmatism; H hyperopia (farsighted); P presbyopia; Mrx spectacle prescription;  CTL contact lenses; OD right eye; OS left eye; OU both eyes  XT exotropia; ET esotropia; PEK punctate epithelial keratitis; PEE punctate epithelial erosions; DES dry eye syndrome; MGD meibomian gland dysfunction; ATs artificial tears; PFAT's preservative free artificial tears; NSC nuclear sclerotic cataract; PSC posterior subcapsular cataract; ERM epi-retinal membrane; PVD posterior vitreous detachment; RD retinal detachment; DM diabetes mellitus; DR diabetic retinopathy; NPDR non-proliferative diabetic retinopathy; PDR proliferative diabetic retinopathy; CSME clinically significant macular edema; DME diabetic macular edema; dbh dot blot hemorrhages; CWS cotton wool spot; POAG primary open angle glaucoma; C/D cup-to-disc ratio; HVF humphrey visual field; GVF goldmann visual field; OCT optical  coherence tomography; IOP intraocular pressure; BRVO Branch retinal vein occlusion; CRVO central retinal vein occlusion; CRAO central  retinal artery occlusion; BRAO branch retinal artery occlusion; RT retinal tear; SB scleral buckle; PPV pars plana vitrectomy; VH Vitreous hemorrhage; PRP panretinal laser photocoagulation; IVK intravitreal kenalog; VMT vitreomacular traction; MH Macular hole;  NVD neovascularization of the disc; NVE neovascularization elsewhere; AREDS age related eye disease study; ARMD age related macular degeneration; POAG primary open angle glaucoma; EBMD epithelial/anterior basement membrane dystrophy; ACIOL anterior chamber intraocular lens; IOL intraocular lens; PCIOL posterior chamber intraocular lens; Phaco/IOL phacoemulsification with intraocular lens placement; [place] photorefractive keratectomy; LASIK laser assisted in situ keratomileusis; HTN hypertension; DM diabetes mellitus; COPD chronic obstructive pulmonary disease

## 2017-07-23 ENCOUNTER — Ambulatory Visit (INDEPENDENT_AMBULATORY_CARE_PROVIDER_SITE_OTHER): Payer: Medicare Other | Admitting: Ophthalmology

## 2017-07-23 ENCOUNTER — Encounter (INDEPENDENT_AMBULATORY_CARE_PROVIDER_SITE_OTHER): Payer: Self-pay | Admitting: Ophthalmology

## 2017-07-23 DIAGNOSIS — H35372 Puckering of macula, left eye: Secondary | ICD-10-CM

## 2017-07-23 DIAGNOSIS — H43812 Vitreous degeneration, left eye: Secondary | ICD-10-CM | POA: Diagnosis not present

## 2017-07-23 DIAGNOSIS — H35352 Cystoid macular degeneration, left eye: Secondary | ICD-10-CM | POA: Diagnosis not present

## 2017-07-23 DIAGNOSIS — H25813 Combined forms of age-related cataract, bilateral: Secondary | ICD-10-CM

## 2017-10-21 ENCOUNTER — Other Ambulatory Visit: Payer: Self-pay | Admitting: Gynecology

## 2017-10-21 DIAGNOSIS — Z1231 Encounter for screening mammogram for malignant neoplasm of breast: Secondary | ICD-10-CM

## 2017-10-22 NOTE — Progress Notes (Signed)
[facility] - Clinic Note  10/23/2017     CHIEF COMPLAINT Patient presents for Retina Follow Up   HISTORY OF PRESENT ILLNESS: Michelle Cook is a 67 y.o. female who presents to the clinic today for:   HPI    Retina Follow Up    Patient presents with  Other.  In left eye.  This started months ago.  Severity is mild.  Duration of months.  Since onset it is stable.  I, the attending physician,  performed the HPI with the patient and updated documentation appropriately.          Comments    67 y/o female pt here for 3 mo f/u for ERM OS.  No change in [place] OU.  Denies pain, flashes, floaters.  Refresh prn OU.       Last edited by Rennis Chris, MD on 10/23/2017  9:10 AM. (History)      Referring physician: Veryl Speak, FNP [address] [place], [place] [address]  HISTORICAL INFORMATION:   Selected notes from the MEDICAL RECORD NUMBER Referred by Dr. Laruth Bouchard for concern of ERM w/ edema OS;  LEE- 05.14.19 (S. Groat) [BCVA OD: 20/25 OS: 20/25] Ocular Hx- cataract OU, hyperopia, presbyopia, ERM w/ edema OS, allergic conjunctivitis OU PMH-     CURRENT MEDICATIONS: No current outpatient medications on file. (Ophthalmic Drugs)   No current facility-administered medications for this visit.  (Ophthalmic Drugs)   Current Outpatient Medications (Other)  Medication Sig  . Cholecalciferol (VITAMIN D PO) Take by mouth.  . Probiotic Product (PROBIOTIC DAILY) CAPS Take 1 each by mouth daily.   No current facility-administered medications for this visit.  (Other)      REVIEW OF SYSTEMS: ROS    Positive for: Eyes   Negative for: Constitutional, Gastrointestinal, Neurological, Skin, Genitourinary, Musculoskeletal, HENT, Endocrine, Cardiovascular, Respiratory, Psychiatric, Allergic/Imm, Heme/Lymph   Last edited by Celine Mans, COA on 10/23/2017  8:37 AM. (History)       ALLERGIES Allergies  Allergen Reactions  .  Sulfamethoxazole-Trimethoprim   . Sulfa Antibiotics Rash    PAST MEDICAL HISTORY Past Medical History:  Diagnosis Date  . Chicken pox   . STD (sexually transmitted disease)    Herpes   Past Surgical History:  Procedure Laterality Date  . TUBAL LIGATION      FAMILY HISTORY Family History  Problem Relation Age of Onset  . COPD Mother   . Heart failure Mother   . Hypertension Mother   . Hypertension Sister   . Hypertension Brother     SOCIAL HISTORY Social History   Tobacco Use  . Smoking status: Never Smoker  . Smokeless tobacco: Never Used  Substance Use Topics  . Alcohol use: Yes    Alcohol/week: 0.0 standard drinks    Comment: occasionally  . Drug use: No         OPHTHALMIC EXAM:  Base Eye Exam    Visual Acuity (Snellen - Linear)      Right Left   Dist cc 20/40 20/30   Dist ph cc 20/25 -2 20/25   Correction:  Glasses       Tonometry (Tonopen, 8:38 AM)      Right Left   Pressure 11 11       Pupils      Dark Light Shape React APD   Right 4 3 Round Brisk None   Left 4 3 Round Brisk None  Visual Fields (Counting fingers)      Left Right    Full Full       Extraocular Movement      Right Left    Full, Ortho Full, Ortho       Neuro/Psych    Oriented x3:  Yes   Mood/Affect:  Normal       Dilation    Both eyes:  1.0% Mydriacyl, 2.5% Phenylephrine @ 8:38 AM        Slit Lamp and Fundus Exam    Slit Lamp Exam      Right Left   Lids/Lashes Dermatochalasis - upper lid Dermatochalasis - upper lid   Conjunctiva/Sclera White and quiet White and quiet   Cornea Arcus, 1+ diffuse Punctate epithelial erosions Arcus, 1+ diffuse Punctate epithelial erosions   Anterior Chamber Deep and quiet Deep and quiet   Iris Round and well dilated Round and well dilated   Lens 2+ Nuclear sclerosis, 2-3+ Cortical cataract 2+ Nuclear sclerosis, 2-3+ Cortical cataract   Vitreous Vitreous syneresis Vitreous syneresis, Posterior vitreous detachment        Fundus Exam      Right Left   Disc Pink and Sharp Pink and Sharp   C/D Ratio 0.3 0.3   Macula Good foveal reflex, mild Retinal pigment epithelial mottling, No heme or edema Central Epiretinal membrane with striae/pucker, lamellar hole / pseudo hole with surrounding cystic change   Vessels Mild Vascular attenuation, AV crossing changes, Copper wiring Mild Vascular attenuation, AV crossing changes, mild Copper wiring, mildly Tortuous   Periphery Attached Attached          IMAGING AND PROCEDURES  Imaging and Procedures for @TODAY @  OCT, Retina - OU - Both Eyes       Right Eye Quality was good. Central Foveal Thickness: 236. Progression has been stable. Findings include normal foveal contour, no IRF, no SRF.   Left Eye Quality was good. Central Foveal Thickness: 544. Progression has been stable. Findings include abnormal foveal contour, epiretinal membrane, no SRF, macular pucker.   Notes *Images captured and stored on drive  Diagnosis / Impression:  OD: NFP; no IRF/SRF OS: ERM w/ foveoschisis and pucker -- stable from prior  Clinical management:  See below  Abbreviations: NFP - Normal foveal profile. CME - cystoid macular edema. PED - pigment epithelial detachment. IRF - intraretinal fluid. SRF - subretinal fluid. EZ - ellipsoid zone. ERM - epiretinal membrane. ORA - outer retinal atrophy. ORT - outer retinal tubulation. SRHM - subretinal hyper-reflective material                 ASSESSMENT/PLAN:    ICD-10-CM   1. Epiretinal membrane (ERM) of left eye H35.372 OCT, Retina - OU - Both Eyes  2. Cystoid macular edema of left eye H35.352   3. Posterior vitreous detachment of left eye H43.812   4. Combined forms of age-related cataract of both eyes H25.813     1,2. Epiretinal membrane w/ pucker, pseudohole and foveoschisis OS- The natural history, anatomy, potential for loss of vision, and treatment options including vitrectomy techniques and the complications of  endophthalmitis, retinal detachment, vitreous hemorrhage, cataract progression and permanent vision loss discussed with the patient. - stable from prior -- no new symptoms -- remains asymptomatic; no metamorphopsia - stable foveoschisis OS -- no CME/leakage on FA - BCVA 20/25 - recommend monitoring for now due to great VA and asymptomatic -- pt in agreement - F/U 6 months, DFE, OCT  3. PVD / vitreous  syneresis OS  Discussed findings and prognosis  No RT or RD on 360 peripheral exam  Reviewed s/s of RT/RD  Strict return precautions for any such RT/RD signs/symptoms  4. Combined form age-related cataract OU-  - The symptoms of cataract, surgical options, and treatments and risks were discussed with patient. - discussed diagnosis and progression - not yet visually significant - monitor for now   Ophthalmic Meds Ordered this visit:  No orders of the defined types were placed in this encounter.      Return in about 6 months (around 04/24/2018) for F/U ERM OS, DFE, OCT.  There are no Patient Instructions on file for this visit.   Explained the diagnoses, plan, and follow up with the patient and they expressed understanding.  Patient expressed understanding of the importance of proper follow up care.   This document serves as a record of services personally performed by Karie Chimera, MD, PhD. It was created on their behalf by Laurian Brim, OA, an ophthalmic assistant. The creation of this record is the provider's dictation and/or activities during the visit.    Electronically signed by: Laurian Brim, OA  08.29.2019 12:16 PM     Karie Chimera, M.D., Ph.D. Diseases & Surgery of the Retina and Vitreous [facility]   I have reviewed the above documentation for accuracy and completeness, and I agree with the above. Karie Chimera, M.D., Ph.D. 10/23/17 12:18 PM   Abbreviations: M myopia (nearsighted); A astigmatism; H hyperopia (farsighted); P  presbyopia; Mrx spectacle prescription;  CTL contact lenses; OD right eye; OS left eye; OU both eyes  XT exotropia; ET esotropia; PEK punctate epithelial keratitis; PEE punctate epithelial erosions; DES dry eye syndrome; MGD meibomian gland dysfunction; ATs artificial tears; PFAT's preservative free artificial tears; NSC nuclear sclerotic cataract; PSC posterior subcapsular cataract; ERM epi-retinal membrane; PVD posterior vitreous detachment; RD retinal detachment; DM diabetes mellitus; DR diabetic retinopathy; NPDR non-proliferative diabetic retinopathy; PDR proliferative diabetic retinopathy; CSME clinically significant macular edema; DME diabetic macular edema; dbh dot blot hemorrhages; CWS cotton wool spot; POAG primary open angle glaucoma; C/D cup-to-disc ratio; HVF humphrey visual field; GVF goldmann visual field; OCT optical coherence tomography; IOP intraocular pressure; BRVO Branch retinal vein occlusion; CRVO central retinal vein occlusion; CRAO central retinal artery occlusion; BRAO branch retinal artery occlusion; RT retinal tear; SB scleral buckle; PPV pars plana vitrectomy; VH Vitreous hemorrhage; PRP panretinal laser photocoagulation; IVK intravitreal kenalog; VMT vitreomacular traction; MH Macular hole;  NVD neovascularization of the disc; NVE neovascularization elsewhere; AREDS age related eye disease study; ARMD age related macular degeneration; POAG primary open angle glaucoma; EBMD epithelial/anterior basement membrane dystrophy; ACIOL anterior chamber intraocular lens; IOL intraocular lens; PCIOL posterior chamber intraocular lens; Phaco/IOL phacoemulsification with intraocular lens placement; PRK photorefractive keratectomy; LASIK laser assisted in situ keratomileusis; HTN hypertension; DM diabetes mellitus; COPD chronic obstructive pulmonary disease

## 2017-10-23 ENCOUNTER — Encounter (INDEPENDENT_AMBULATORY_CARE_PROVIDER_SITE_OTHER): Payer: Self-pay | Admitting: Ophthalmology

## 2017-10-23 ENCOUNTER — Ambulatory Visit (INDEPENDENT_AMBULATORY_CARE_PROVIDER_SITE_OTHER): Payer: Medicare Other | Admitting: Ophthalmology

## 2017-10-23 DIAGNOSIS — H33199 Other retinoschisis and retinal cysts, unspecified eye: Secondary | ICD-10-CM | POA: Diagnosis not present

## 2017-10-23 DIAGNOSIS — H43812 Vitreous degeneration, left eye: Secondary | ICD-10-CM

## 2017-10-23 DIAGNOSIS — H35372 Puckering of macula, left eye: Secondary | ICD-10-CM | POA: Diagnosis not present

## 2017-10-23 DIAGNOSIS — H25813 Combined forms of age-related cataract, bilateral: Secondary | ICD-10-CM

## 2017-10-30 ENCOUNTER — Ambulatory Visit: Payer: Medicare Other

## 2017-11-23 ENCOUNTER — Ambulatory Visit
Admission: RE | Admit: 2017-11-23 | Discharge: 2017-11-23 | Disposition: A | Discharge status: Home / Self Care | Payer: Medicare Other | Source: Ambulatory Visit | Attending: Gynecology | Admitting: Gynecology

## 2017-11-23 DIAGNOSIS — Z1231 Encounter for screening mammogram for malignant neoplasm of breast: Secondary | ICD-10-CM

## 2017-12-07 ENCOUNTER — Other Ambulatory Visit: Payer: Self-pay

## 2017-12-07 ENCOUNTER — Encounter: Payer: Self-pay | Admitting: Family

## 2017-12-07 ENCOUNTER — Ambulatory Visit (INDEPENDENT_AMBULATORY_CARE_PROVIDER_SITE_OTHER): Payer: Medicare Other | Admitting: Family

## 2017-12-07 VITALS — BP 114/76 | HR 66 | Temp 97.5°F | Ht 64.0 in | Wt 124.0 lb

## 2017-12-07 DIAGNOSIS — Z13220 Encounter for screening for lipoid disorders: Secondary | ICD-10-CM | POA: Diagnosis not present

## 2017-12-07 DIAGNOSIS — J209 Acute bronchitis, unspecified: Secondary | ICD-10-CM | POA: Diagnosis not present

## 2017-12-07 DIAGNOSIS — Z0000 Encounter for general adult medical examination without abnormal findings: Secondary | ICD-10-CM | POA: Diagnosis not present

## 2017-12-07 DIAGNOSIS — E2839 Other primary ovarian failure: Secondary | ICD-10-CM | POA: Diagnosis not present

## 2017-12-07 MED ORDER — DOXYCYCLINE HYCLATE 100 MG PO TABS: 100.0000 mg | ORAL_TABLET | Freq: Two times a day (BID) | ORAL | 0 refills | Status: DC

## 2017-12-07 MED ORDER — ZOSTER VAC RECOMB ADJUVANTED 50 MCG/0.5ML IM SUSR: 0.5000 mL | Freq: Once | INTRAMUSCULAR | 1 refills | Status: AC

## 2017-12-07 NOTE — Progress Notes (Signed)
Michelle Cook is a 67 y.o. female with the following history as recorded in EpicCare:  Patient Active Problem List   Diagnosis Date Noted  . Medicare annual wellness visit, initial 03/11/2016  . Routine general medical examination at a health care facility 03/11/2016  . Acute bronchitis 01/29/2013  . URI 12/12/2009  . HYPERLIPIDEMIA 12/06/2008    Current Outpatient Medications  Medication Sig Dispense Refill  . benzonatate (TESSALON) 100 MG capsule Swallow whole one (165m) capsule by mouth 3 times a day  as needed.Do not break, chew, dissolve, cut or crush.    . cetirizine (ZYRTEC) 5 MG tablet Take by mouth.    . Cholecalciferol (VITAMIN D PO) Take by mouth.    . fluticasone (FLONASE) 50 MCG/ACT nasal spray OTC FLONASE AS NEEDED.    .Marland KitchenguaiFENesin 200 MG tablet Take by mouth.    . Probiotic Product (PROBIOTIC DAILY) CAPS Take 1 each by mouth daily.    .Marland Kitchendoxycycline (VIBRA-TABS) 100 MG tablet Take 1 tablet (100 mg total) by mouth 2 (two) times daily. 20 tablet 0  . Zoster Vaccine Adjuvanted ([facility] injection Inject 0.5 mLs into the muscle once for 1 dose. Repeat after 2 months 0.5 mL 1   No current facility-administered medications for this visit.     Allergies: Sulfamethoxazole-trimethoprim and Sulfa antibiotics  Past Medical History:  Diagnosis Date  . Chicken pox   . STD (sexually transmitted disease)    Herpes    Past Surgical History:  Procedure Laterality Date  . TUBAL LIGATION      Family History  Problem Relation Age of Onset  . COPD Mother   . Heart failure Mother   . Hypertension Mother   . Hypertension Sister   . Hypertension Brother     Social History   Tobacco Use  . Smoking status: Never Smoker  . Smokeless tobacco: Never Used  Substance Use Topics  . Alcohol use: Yes    Alcohol/week: 0.0 standard drinks    Comment: occasionally    Subjective:   Here for medicare wellness, no new complaints. Please see A/P for status and treatment of chronic  medical problems.   Diet: heart healthy or DM if diabetic Physical activity: sedentary Depression/mood screen: negative Hearing: intact to whispered voice Visual acuity: grossly normal, performs annual eye exam  ADLs: capable Fall risk: none Home safety: good Cognitive evaluation: intact to orientation, naming, recall and repetition EOL planning: adv directives discussed  I have personally reviewed and have noted 1. The patient's medical and social history - reviewed today no changes 2. Their use of alcohol, tobacco or illicit drugs 3. Their current medications and supplements 4. The patient's functional ability including ADL's, fall risks, home safety risks and hearing or visual impairment. 5. Diet and physical activities 6. Evidence for depression or mood disorders 7. Care team reviewed and updated (available in snapshot)     Objective:  Vitals:   12/07/17 1541  BP: 114/76  Pulse: 66  Temp: (!) 97.5 F (36.4 C)  TempSrc: Oral  SpO2: 96%  Weight: 124 lb 0.6 oz (56.3 kg)  Height: 5' 4" (1.626 m)    General: Well developed, well nourished, in no acute distress  Skin : Warm and dry.  Head: Normocephalic and atraumatic  Eyes: Sclera and conjunctiva clear; pupils round and reactive to light; extraocular movements intact  Ears: External normal; canals clear; tympanic membranes normal  Oropharynx: Pink, supple. No suspicious lesions  Neck: Supple without thyromegaly, adenopathy  Lungs: Respirations  unlabored; clear to auscultation bilaterally without wheeze, rales, rhonchi  CVS exam: normal rate and regular rhythm.  Abdomen: Soft; nontender; nondistended; normoactive bowel sounds; no masses or hepatosplenomegaly  Musculoskeletal: No deformities; no active joint inflammation  Extremities: No edema, cyanosis, clubbing  Vessels: Symmetric bilaterally  Neurologic: Alert and oriented; speech intact; face symmetrical; moves all extremities well; CNII-XII intact without focal  deficit   Assessment:  1. PE (physical exam), annual   2. Ovarian failure   3. Lipid screening   4. Acute bronchitis, unspecified organism     Plan:  Age appropriate preventive healthcare needs addressed; encouraged regular eye doctor and dental exams; encouraged regular exercise; will update labs and refills as needed today; follow-up to be determined; She will return for DEXA and fasting labs; on the same day, she will come to the office for her flu and pneumovax; Rx is given for Shingrix- hold and fill at later date;  ? Status of colon cancer screen- she will get copy of information recently ordered by Medicare; to consider, colonoscopy or Cologuard.   Will re-treat bronchitis with Doxycycline x 10 days; encouraged to use her Zyrtec and Flonase;   Return for DEXA/ fasting labs.  Orders Placed This Encounter  Procedures  . DG Bone Density    Standing Status:   Future    Standing Expiration Date:   02/07/2019    Order Specific Question:   Reason for Exam (SYMPTOM  OR DIAGNOSIS REQUIRED)    Answer:   ovarian failure    Order Specific Question:   Preferred imaging location?    Answer:   [facility]  . CBC w/Diff    Standing Status:   Future    Standing Expiration Date:   12/07/2018  . Comp Met (CMET)    Standing Status:   Future    Standing Expiration Date:   12/07/2018  . Lipid panel    Standing Status:   Future    Standing Expiration Date:   12/08/2018  . Vitamin D (25 hydroxy)    Standing Status:   Future    Standing Expiration Date:   12/07/2018    Requested Prescriptions   Signed Prescriptions Disp Refills  . Zoster Vaccine Adjuvanted [facility]) injection 0.5 mL 1    Sig: Inject 0.5 mLs into the muscle once for 1 dose. Repeat after 2 months  . doxycycline (VIBRA-TABS) 100 MG tablet 20 tablet 0    Sig: Take 1 tablet (100 mg total) by mouth 2 (two) times daily.

## 2017-12-07 NOTE — Patient Instructions (Addendum)
Return for flu shot and pneumovax once you feel better; Please start taking your Zyrtec and Flonase as discussed;  Please get the colon cancer screening information as we discussed.

## 2017-12-16 ENCOUNTER — Other Ambulatory Visit (INDEPENDENT_AMBULATORY_CARE_PROVIDER_SITE_OTHER): Payer: Medicare Other

## 2017-12-16 ENCOUNTER — Ambulatory Visit (INDEPENDENT_AMBULATORY_CARE_PROVIDER_SITE_OTHER)
Admission: RE | Admit: 2017-12-16 | Discharge: 2017-12-16 | Disposition: A | Discharge status: Home / Self Care | Payer: Medicare Other | Source: Ambulatory Visit | Attending: Family | Admitting: Family

## 2017-12-16 DIAGNOSIS — E2839 Other primary ovarian failure: Secondary | ICD-10-CM

## 2017-12-16 DIAGNOSIS — Z0000 Encounter for general adult medical examination without abnormal findings: Secondary | ICD-10-CM

## 2017-12-16 DIAGNOSIS — Z13220 Encounter for screening for lipoid disorders: Secondary | ICD-10-CM

## 2017-12-16 LAB — CBC WITH DIFFERENTIAL/PLATELET
Basophils Absolute: 0.1 10*3/uL (ref 0.0–0.1)
Basophils Relative: 2.2 % (ref 0.0–3.0)
EOS PCT: 2.6 % (ref 0.0–5.0)
Eosinophils Absolute: 0.1 10*3/uL (ref 0.0–0.7)
HEMATOCRIT: 42 % (ref 36.0–46.0)
Hemoglobin: 14 g/dL (ref 12.0–15.0)
LYMPHS PCT: 37 % (ref 12.0–46.0)
Lymphs Abs: 1.7 10*3/uL (ref 0.7–4.0)
MCHC: 33.4 g/dL (ref 30.0–36.0)
MCV: 85.4 fl (ref 78.0–100.0)
MONOS PCT: 9.3 % (ref 3.0–12.0)
Monocytes Absolute: 0.4 10*3/uL (ref 0.1–1.0)
Neutro Abs: 2.3 10*3/uL (ref 1.4–7.7)
Neutrophils Relative %: 48.9 % (ref 43.0–77.0)
Platelets: 325 10*3/uL (ref 150.0–400.0)
RBC: 4.92 Mil/uL (ref 3.87–5.11)
RDW: 13.2 % (ref 11.5–15.5)
WBC: 4.7 10*3/uL (ref 4.0–10.5)

## 2017-12-16 LAB — COMPREHENSIVE METABOLIC PANEL
ALBUMIN: 3.9 g/dL (ref 3.5–5.2)
ALT: 11 U/L (ref 0–35)
AST: 14 U/L (ref 0–37)
Alkaline Phosphatase: 83 U/L (ref 39–117)
BUN: 12 mg/dL (ref 6–23)
CALCIUM: 9.4 mg/dL (ref 8.4–10.5)
CO2: 28 meq/L (ref 19–32)
Chloride: 105 mEq/L (ref 96–112)
Creatinine, Ser: 0.97 mg/dL (ref 0.40–1.20)
GFR: 60.81 mL/min (ref >60.00)
Glucose, Bld: 98 mg/dL (ref 70–99)
POTASSIUM: 3.6 meq/L (ref 3.5–5.1)
Sodium: 140 mEq/L (ref 135–145)
Total Bilirubin: 0.9 mg/dL (ref 0.2–1.2)
Total Protein: 6.9 g/dL (ref 6.0–8.3)

## 2017-12-16 LAB — VITAMIN D 25 HYDROXY (VIT D DEFICIENCY, FRACTURES): VITD: 30.55 ng/mL (ref 30.00–100.00)

## 2017-12-16 LAB — LIPID PANEL
CHOL/HDL RATIO: 4
CHOLESTEROL: 183 mg/dL (ref 0–200)
HDL: 43.2 mg/dL (ref >39.00)
LDL Cholesterol: 112 mg/dL — ABNORMAL HIGH (ref 0–99)
NonHDL: 139.46
TRIGLYCERIDES: 137 mg/dL (ref 0.0–149.0)
VLDL: 27.4 mg/dL (ref 0.0–40.0)

## 2017-12-31 ENCOUNTER — Telehealth: Payer: Self-pay

## 2017-12-31 NOTE — Telephone Encounter (Signed)
Currently awaiting for results so patient's chart can be updated and notes can be  abstracted.

## 2018-02-01 ENCOUNTER — Other Ambulatory Visit: Payer: Medicare Other

## 2018-02-01 ENCOUNTER — Encounter: Payer: Self-pay | Admitting: Family

## 2018-02-01 ENCOUNTER — Ambulatory Visit (INDEPENDENT_AMBULATORY_CARE_PROVIDER_SITE_OTHER): Payer: Medicare Other | Admitting: Family

## 2018-02-01 VITALS — BP 112/78 | HR 60 | Temp 98.0°F | Ht 64.0 in | Wt 126.0 lb

## 2018-02-01 DIAGNOSIS — R350 Frequency of micturition: Secondary | ICD-10-CM | POA: Diagnosis not present

## 2018-02-01 LAB — POC URINALSYSI DIPSTICK (AUTOMATED)
BILIRUBIN UA: NEGATIVE
Blood, UA: NEGATIVE
GLUCOSE UA: NEGATIVE
LEUKOCYTES UA: NEGATIVE
Nitrite, UA: NEGATIVE
PROTEIN UA: NEGATIVE
Spec Grav, UA: 1.015 (ref 1.010–1.025)
Urobilinogen, UA: 0.2 E.U./dL
pH, UA: 6.5 (ref 5.0–8.0)

## 2018-02-01 MED ORDER — NITROFURANTOIN MONOHYD MACRO 100 MG PO CAPS: 100.0000 mg | ORAL_CAPSULE | Freq: Two times a day (BID) | ORAL | 0 refills | Status: DC

## 2018-02-01 NOTE — Progress Notes (Signed)
  Michelle Cook is a 67 y.o. female with the following history as recorded in EpicCare:  Patient Active Problem List   Diagnosis Date Noted  . Medicare annual wellness visit, initial 03/11/2016  . Routine general medical examination at a health care facility 03/11/2016  . Acute bronchitis 01/29/2013  . URI 12/12/2009  . HYPERLIPIDEMIA 12/06/2008    Current Outpatient Medications  Medication Sig Dispense Refill  . doxycycline (VIBRA-TABS) 100 MG tablet Take 1 tablet (100 mg total) by mouth 2 (two) times daily. 20 tablet 0  . fluticasone (FLONASE) 50 MCG/ACT nasal spray OTC FLONASE AS NEEDED.    Marland Kitchen. Probiotic Product (PROBIOTIC DAILY) CAPS Take 1 each by mouth daily.    . cetirizine (ZYRTEC) 5 MG tablet Take by mouth.    . Cholecalciferol (VITAMIN D PO) Take by mouth.    . nitrofurantoin, macrocrystal-monohydrate, (MACROBID) 100 MG capsule Take 1 capsule (100 mg total) by mouth 2 (two) times daily. 14 capsule 0   No current facility-administered medications for this visit.     Allergies: Sulfamethoxazole-trimethoprim and Sulfa antibiotics  Past Medical History:  Diagnosis Date  . Chicken pox   . STD (sexually transmitted disease)    Herpes    Past Surgical History:  Procedure Laterality Date  . TUBAL LIGATION      Family History  Problem Relation Age of Onset  . COPD Mother   . Heart failure Mother   . Hypertension Mother   . Hypertension Sister   . Hypertension Brother     Social History   Tobacco Use  . Smoking status: Never Smoker  . Smokeless tobacco: Never Used  Substance Use Topics  . Alcohol use: Yes    Alcohol/week: 0.0 standard drinks    Comment: occasionally    Subjective:  Patient presents with concerns for possible UTI; symptoms x 1 week; + urgency, urinary odor, increased fatigue; no back pain, no fever; does get UTIs 1x per year.       Objective:  Vitals:   02/01/18 1531  BP: 112/78  Pulse: 60  Temp: 98 F (36.7 C)  TempSrc: Oral  SpO2:  98%  Weight: 126 lb (57.2 kg)  Height: 5\' 4"  (1.626 m)    General: Well developed, well nourished, in no acute distress  Skin : Warm and dry.  Head: Normocephalic and atraumatic  Lungs: Respirations unlabored; clear to auscultation bilaterally without wheeze, rales, rhonchi  CVS exam: normal rate and regular rhythm.  Neurologic: Alert and oriented; speech intact; face symmetrical; moves all extremities well; CNII-XII intact without focal deficit   Assessment:  1. Urinary frequency     Plan:  Check U/A and urine culture; Rx for Macrobid 100 mg bid x 7 days; increase fluids, rest and follow-up worse, no better.   No follow-ups on file.  Orders Placed This Encounter  Procedures  . Urine Culture    Standing Status:   Future    Number of Occurrences:   1    Standing Expiration Date:   02/01/2019  . POCT Urinalysis Dipstick (Automated)    Requested Prescriptions   Signed Prescriptions Disp Refills  . nitrofurantoin, macrocrystal-monohydrate, (MACROBID) 100 MG capsule 14 capsule 0    Sig: Take 1 capsule (100 mg total) by mouth 2 (two) times daily.

## 2018-02-03 LAB — URINE CULTURE
MICRO NUMBER: 91470893
SPECIMEN QUALITY:: ADEQUATE

## 2018-02-04 ENCOUNTER — Other Ambulatory Visit: Payer: Self-pay | Admitting: Family

## 2018-02-04 MED ORDER — CEFUROXIME AXETIL 500 MG PO TABS: 500.0000 mg | ORAL_TABLET | Freq: Two times a day (BID) | ORAL | 0 refills | Status: DC

## 2018-03-24 ENCOUNTER — Encounter: Payer: Self-pay | Admitting: Family

## 2018-03-24 ENCOUNTER — Ambulatory Visit (INDEPENDENT_AMBULATORY_CARE_PROVIDER_SITE_OTHER): Payer: Medicare Other | Admitting: Family

## 2018-03-24 VITALS — BP 106/78 | HR 92 | Temp 98.1°F | Ht 64.0 in | Wt 124.1 lb

## 2018-03-24 DIAGNOSIS — J019 Acute sinusitis, unspecified: Secondary | ICD-10-CM | POA: Diagnosis not present

## 2018-03-24 MED ORDER — PREDNISONE 20 MG PO TABS: 20.0000 mg | ORAL_TABLET | Freq: Every day | ORAL | 0 refills | Status: DC

## 2018-03-24 MED ORDER — CEFDINIR 300 MG PO CAPS: 300.0000 mg | ORAL_CAPSULE | Freq: Two times a day (BID) | ORAL | 0 refills | Status: DC

## 2018-03-24 NOTE — Progress Notes (Signed)
Michelle Cook is a 68 y.o. female with the following history as recorded in EpicCare:  Patient Active Problem List   Diagnosis Date Noted  . Medicare annual wellness visit, initial 03/11/2016  . Routine general medical examination at a health care facility 03/11/2016  . Acute bronchitis 01/29/2013  . URI 12/12/2009  . HYPERLIPIDEMIA 12/06/2008    Current Outpatient Medications  Medication Sig Dispense Refill  . Cholecalciferol (VITAMIN D PO) Take by mouth.    . fluticasone (FLONASE) 50 MCG/ACT nasal spray OTC FLONASE AS NEEDED.    Marland Kitchen Probiotic Product (PROBIOTIC DAILY) CAPS Take 1 each by mouth daily.    . cefdinir (OMNICEF) 300 MG capsule Take 1 capsule (300 mg total) by mouth 2 (two) times daily. 20 capsule 0  . cetirizine (ZYRTEC) 5 MG tablet Take by mouth.    . predniSONE (DELTASONE) 20 MG tablet Take 1 tablet (20 mg total) by mouth daily with breakfast. 5 tablet 0   No current facility-administered medications for this visit.     Allergies: Sulfamethoxazole-trimethoprim and Sulfa antibiotics  Past Medical History:  Diagnosis Date  . Chicken pox   . STD (sexually transmitted disease)    Herpes    Past Surgical History:  Procedure Laterality Date  . TUBAL LIGATION      Family History  Problem Relation Age of Onset  . COPD Mother   . Heart failure Mother   . Hypertension Mother   . Hypertension Sister   . Hypertension Brother     Social History   Tobacco Use  . Smoking status: Never Smoker  . Smokeless tobacco: Never Used  Substance Use Topics  . Alcohol use: Yes    Alcohol/week: 0.0 standard drinks    Comment: occasionally    Subjective:  Patient presents with concerns for possible sinus infection; symptoms x 1 1/2 weeks; feels has been exposed at daycare; + sinus pain, pressure; does have underlying allergies- has been using OTC Zyrtec and Flonase; has noticed "bad taste" in back of mouth;      Objective:  Vitals:   03/24/18 1127  BP: 106/78  Pulse:  92  Temp: 98.1 F (36.7 C)  TempSrc: Oral  SpO2: 97%  Weight: 124 lb 1.3 oz (56.3 kg)  Height: 5\' 4"  (1.626 m)    General: Well developed, well nourished, in no acute distress  Skin : Warm and dry.  Head: Normocephalic and atraumatic  Eyes: Sclera and conjunctiva clear; pupils round and reactive to light; extraocular movements intact  Ears: External normal; canals clear; tympanic membranes normal  Oropharynx: Pink, supple. No suspicious lesions  Neck: Supple without thyromegaly, adenopathy  Lungs: Respirations unlabored; clear to auscultation bilaterally without wheeze, rales, rhonchi  CVS exam: normal rate and regular rhythm.  Neurologic: Alert and oriented; speech intact; face symmetrical; moves all extremities well; CNII-XII intact without focal deficit    Assessment:  1. Acute sinusitis, recurrence not specified, unspecified location     Plan:  Rx for Omnicef 300 mg bid x 10 days, Prednisone 20 mg qd x 5 days; Discussed frequency of infections- suspected due to keeping 9 mo grandson; however, if symptoms continue, will need to consider CXR and/or ENT evaluation.  No follow-ups on file.  No orders of the defined types were placed in this encounter.   Requested Prescriptions   Signed Prescriptions Disp Refills  . predniSONE (DELTASONE) 20 MG tablet 5 tablet 0    Sig: Take 1 tablet (20 mg total) by mouth daily with breakfast.  .  cefdinir (OMNICEF) 300 MG capsule 20 capsule 0    Sig: Take 1 capsule (300 mg total) by mouth 2 (two) times daily.

## 2018-03-31 ENCOUNTER — Ambulatory Visit (INDEPENDENT_AMBULATORY_CARE_PROVIDER_SITE_OTHER): Payer: Medicare Other | Admitting: Emergency Medicine

## 2018-03-31 DIAGNOSIS — Z23 Encounter for immunization: Secondary | ICD-10-CM

## 2018-04-19 ENCOUNTER — Ambulatory Visit (INDEPENDENT_AMBULATORY_CARE_PROVIDER_SITE_OTHER)
Admission: RE | Admit: 2018-04-19 | Discharge: 2018-04-19 | Disposition: A | Discharge status: Home / Self Care | Payer: Medicare Other | Source: Ambulatory Visit | Attending: Family | Admitting: Family

## 2018-04-19 ENCOUNTER — Ambulatory Visit (INDEPENDENT_AMBULATORY_CARE_PROVIDER_SITE_OTHER): Payer: Medicare Other | Admitting: Family

## 2018-04-19 ENCOUNTER — Encounter: Payer: Self-pay | Admitting: Family

## 2018-04-19 VITALS — BP 104/60 | HR 78 | Temp 97.9°F | Ht 64.0 in | Wt 122.1 lb

## 2018-04-19 DIAGNOSIS — J329 Chronic sinusitis, unspecified: Secondary | ICD-10-CM | POA: Diagnosis not present

## 2018-04-19 DIAGNOSIS — R05 Cough: Secondary | ICD-10-CM | POA: Diagnosis not present

## 2018-04-19 DIAGNOSIS — R053 Chronic cough: Secondary | ICD-10-CM

## 2018-04-19 NOTE — Progress Notes (Signed)
Michelle Cook is a 68 y.o. female with the following history as recorded in EpicCare:  Patient Active Problem List   Diagnosis Date Noted  . Medicare annual wellness visit, initial 03/11/2016  . Routine general medical examination at a health care facility 03/11/2016  . Acute bronchitis 01/29/2013  . URI 12/12/2009  . HYPERLIPIDEMIA 12/06/2008    Current Outpatient Medications  Medication Sig Dispense Refill  . Cholecalciferol (VITAMIN D PO) Take by mouth.    . fluticasone (FLONASE) 50 MCG/ACT nasal spray OTC FLONASE AS NEEDED.    Marland Kitchen Probiotic Product (PROBIOTIC DAILY) CAPS Take 1 each by mouth daily.    . cetirizine (ZYRTEC) 5 MG tablet Take by mouth.     No current facility-administered medications for this visit.     Allergies: Sulfamethoxazole-trimethoprim and Sulfa antibiotics  Past Medical History:  Diagnosis Date  . Chicken pox   . STD (sexually transmitted disease)    Herpes    Past Surgical History:  Procedure Laterality Date  . TUBAL LIGATION      Family History  Problem Relation Age of Onset  . COPD Mother   . Heart failure Mother   . Hypertension Mother   . Hypertension Sister   . Hypertension Brother     Social History   Tobacco Use  . Smoking status: Never Smoker  . Smokeless tobacco: Never Used  Substance Use Topics  . Alcohol use: Yes    Alcohol/week: 0.0 standard drinks    Comment: occasionally    Subjective:  Was seen 3 weeks ago- had sinus infection; has been feeling better but concerned about persisting cough; has been drinking increased lemon water with benefits; has not been taking her Zyrtec and Flonase recently; no fever, no chest pain; no concerns for acid reflux; no fever, no night sweats; no recent travel;     Objective:  Vitals:   04/19/18 1101  BP: 104/60  Pulse: 78  Temp: 97.9 F (36.6 C)  TempSrc: Oral  SpO2: 94%  Weight: 122 lb 1.9 oz (55.4 kg)  Height: 5\' 4"  (1.626 m)    General: Well developed, well nourished, in  no acute distress  Skin : Warm and dry.  Head: Normocephalic and atraumatic  Eyes: Sclera and conjunctiva clear; pupils round and reactive to light; extraocular movements intact  Ears: External normal; canals clear; tympanic membranes normal  Oropharynx: Pink, supple. No suspicious lesions  Neck: Supple without thyromegaly, adenopathy  Lungs: Respirations unlabored; clear to auscultation bilaterally without wheeze, rales, rhonchi  CVS exam: normal rate and regular rhythm.  Neurologic: Alert and oriented; speech intact; face symmetrical; moves all extremities well; CNII-XII intact without focal deficit   Assessment:  1. Persistent cough for 3 weeks or longer   2. Chronic sinusitis, unspecified location     Plan:  Update CXR; re-start Zyrtec and Flonase; refer to ENT; may also need to consider asthma/ allergy specialist; follow-up to be determined.   No follow-ups on file.  Orders Placed This Encounter  Procedures  . DG Chest 2 View    Standing Status:   Future    Number of Occurrences:   1    Standing Expiration Date:   06/18/2019    Order Specific Question:   Reason for Exam (SYMPTOM  OR DIAGNOSIS REQUIRED)    Answer:   persisting cough    Order Specific Question:   Preferred imaging location?    Answer:   [facility]    Order Specific Question:   Radiology Contrast  Protocol - do NOT remove file path    Answer:   [email or web address]  . Ambulatory referral to ENT    Referral Priority:   Routine    Referral Type:   Consultation    Referral Reason:   Specialty Services Required    Requested Specialty:   Otolaryngology    Number of Visits Requested:   1    Requested Prescriptions    No prescriptions requested or ordered in this encounter

## 2018-04-20 ENCOUNTER — Other Ambulatory Visit: Payer: Self-pay | Admitting: Family

## 2018-04-20 DIAGNOSIS — J449 Chronic obstructive pulmonary disease, unspecified: Secondary | ICD-10-CM

## 2018-04-24 NOTE — Progress Notes (Signed)
[facility] - Clinic Note  04/26/2018     CHIEF COMPLAINT Patient presents for Retina Follow Up   HISTORY OF PRESENT ILLNESS: Michelle Cook is a 68 y.o. female who presents to the clinic today for:   HPI    Retina Follow Up    Patient presents with  Other (ERM).  In left eye.  Severity is moderate.  Duration of 6 months.  Since onset it is stable.  I, the attending physician,  performed the HPI with the patient and updated documentation appropriately.          Comments    Patient states vision the same OU.        Last edited by Rennis Chris, MD on 04/26/2018  9:36 AM. (History)    pt states she is not having any problems with her vision, she states she was recently diagnosed with COPD (no hx of smoking) after several bouts with bronchitis  Referring physician: Veryl Speak, FNP [address] [place], [place] [address]  HISTORICAL INFORMATION:   Selected notes from the MEDICAL RECORD NUMBER Referred by Dr. Laruth Bouchard for concern of ERM w/ edema OS;  LEE- 05.14.19 (S. Groat) [BCVA OD: 20/25 OS: 20/25] Ocular Hx- cataract OU, hyperopia, presbyopia, ERM w/ edema OS, allergic conjunctivitis OU PMH-     CURRENT MEDICATIONS: No current outpatient medications on file. (Ophthalmic Drugs)   No current facility-administered medications for this visit.  (Ophthalmic Drugs)   Current Outpatient Medications (Other)  Medication Sig  . Cholecalciferol (VITAMIN D PO) Take by mouth.  . fluticasone (FLONASE) 50 MCG/ACT nasal spray OTC FLONASE AS NEEDED.  Marland Kitchen Probiotic Product (PROBIOTIC DAILY) CAPS Take 1 each by mouth daily.  . cetirizine (ZYRTEC) 5 MG tablet Take by mouth.   No current facility-administered medications for this visit.  (Other)      REVIEW OF SYSTEMS: ROS    Positive for: Eyes   Negative for: Constitutional, Gastrointestinal, Neurological, Skin, Genitourinary, Musculoskeletal, HENT, Endocrine, Cardiovascular, Respiratory,  Psychiatric, Allergic/Imm, Heme/Lymph   Last edited by Annalee Genta D on 04/26/2018  8:40 AM. (History)       ALLERGIES Allergies  Allergen Reactions  . Sulfamethoxazole-Trimethoprim   . Sulfa Antibiotics Rash    PAST MEDICAL HISTORY Past Medical History:  Diagnosis Date  . Chicken pox   . STD (sexually transmitted disease)    Herpes   Past Surgical History:  Procedure Laterality Date  . TUBAL LIGATION      FAMILY HISTORY Family History  Problem Relation Age of Onset  . COPD Mother   . Heart failure Mother   . Hypertension Mother   . Hypertension Sister   . Hypertension Brother     SOCIAL HISTORY Social History   Tobacco Use  . Smoking status: Never Smoker  . Smokeless tobacco: Never Used  Substance Use Topics  . Alcohol use: Yes    Alcohol/week: 0.0 standard drinks    Comment: occasionally  . Drug use: No         OPHTHALMIC EXAM:  Base Eye Exam    Visual Acuity (Snellen - Linear)      Right Left   Dist cc 20/40 -2 20/25 -2   Dist ph cc 20/20 -2 NI   Correction:  Glasses       Tonometry (Tonopen, 8:51 AM)      Right Left   Pressure 14 16       Pupils  Dark Light Shape React APD   Right 4 3 Round Brisk None   Left 4 3 Round Brisk None       Visual Fields (Counting fingers)      Left Right    Full Full       Extraocular Movement      Right Left    Full, Ortho Full, Ortho       Neuro/Psych    Oriented x3:  Yes   Mood/Affect:  Normal       Dilation    Both eyes:  1.0% Mydriacyl, 2.5% Phenylephrine @ 8:51 AM        Slit Lamp and Fundus Exam    Slit Lamp Exam      Right Left   Lids/Lashes Dermatochalasis - upper lid Dermatochalasis - upper lid   Conjunctiva/Sclera White and quiet White and quiet   Cornea Arcus, 1+ diffuse Punctate epithelial erosions Arcus, 1+ diffuse Punctate epithelial erosions   Anterior Chamber Deep and quiet Deep and quiet   Iris Round and well dilated Round and well dilated   Lens 2-3+ Nuclear  sclerosis, 3+ Cortical cataract 2-3+ Nuclear sclerosis, 3+ Cortical cataract   Vitreous Vitreous syneresis, Posterior vitreous detachment Vitreous syneresis, Posterior vitreous detachment       Fundus Exam      Right Left   Disc Pink and Sharp Pink and Sharp   C/D Ratio 0.3 0.3   Macula blunted foveal reflex, mild Retinal pigment epithelial mottling, No heme or edema Central Epiretinal membrane with striae/pucker, lamellar hole / pseudo hole with surrounding cystic change -- stable from prior   Vessels AV crossing changes with mild attenuation Mild Vascular attenuation, AV crossing changes, mild Copper wiring   Periphery Attached Attached        Refraction    Wearing Rx      Sphere Cylinder Axis Add   Right +3.00 +1.00 164 +2.25   Left +3.00 +1.25 017 +2.25   Type:  PAL          IMAGING AND PROCEDURES  Imaging and Procedures for @TODAY @  OCT, Retina - OU - Both Eyes       Right Eye Quality was good. Central Foveal Thickness: 228. Progression has been stable. Findings include normal foveal contour, no IRF, no SRF.   Left Eye Quality was good. Central Foveal Thickness: 539. Progression has been stable. Findings include abnormal foveal contour, epiretinal membrane, no SRF, macular pucker (No interval change).   Notes *Images captured and stored on drive  Diagnosis / Impression:  OD: NFP; no IRF/SRF OS: ERM w/ foveoschisis and pucker -- no interval change  Clinical management:  See below  Abbreviations: NFP - Normal foveal profile. CME - cystoid macular edema. PED - pigment epithelial detachment. IRF - intraretinal fluid. SRF - subretinal fluid. EZ - ellipsoid zone. ERM - epiretinal membrane. ORA - outer retinal atrophy. ORT - outer retinal tubulation. SRHM - subretinal hyper-reflective material                 ASSESSMENT/PLAN:    ICD-10-CM   1. 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 Posterior vitreous  detachment of left eye H43.812   4. Combined forms of age-related cataract of both eyes H25.813   5. Cystoid macular edema of left eye H35.352   6. Retinal edema H35.81 OCT, Retina - OU - Both Eyes    1,2. Epiretinal membrane w/ pucker, pseudohole and  foveoschisis OS- The natural history, anatomy, potential for loss of vision, and treatment options including vitrectomy techniques and the complications of endophthalmitis, retinal detachment, vitreous hemorrhage, cataract progression and permanent vision loss discussed with the patient. - stable from prior -- no new symptoms -- remains asymptomatic; no metamorphopsia - stable foveoschisis OS -- no CME/leakage on FA - BCVA 20/25 - recommend monitoring for now due to great VA and asymptomatic -- pt in agreement - F/U 9-12 months, DFE, OCT  3. PVD / vitreous syneresis OS  Discussed findings and prognosis  No RT or RD on 360 peripheral exam  Reviewed s/s of RT/RD  Strict return precautions for any such RT/RD signs/symptoms  4. Combined form age-related cataract OU-  - The symptoms of cataract, surgical options, and treatments and risks were discussed with patient. - discussed diagnosis and progression - not yet visually significant - monitor for now   Ophthalmic Meds Ordered this visit:  No orders of the defined types were placed in this encounter.      Return for f/u 9-12 months ERM, DFE, OCT.  There are no Patient Instructions on file for this visit.   Explained the diagnoses, plan, and follow up with the patient and they expressed understanding.  Patient expressed understanding of the importance of proper follow up care.   This document serves as a record of services personally performed by Karie Chimera, MD, PhD. It was created on their behalf by Laurian Brim, OA, an ophthalmic assistant. The creation of this record is the provider's dictation and/or activities during the visit.    Electronically signed by: Laurian Brim, OA   02.29.2020 11:16 AM    Karie Chimera, M.D., Ph.D. Diseases & Surgery of the Retina and Vitreous [facility]   I have reviewed the above documentation for accuracy and completeness, and I agree with the above. Karie Chimera, M.D., Ph.D. 04/30/18 11:18 AM    Abbreviations: M myopia (nearsighted); A astigmatism; H hyperopia (farsighted); P presbyopia; Mrx spectacle prescription;  CTL contact lenses; OD right eye; OS left eye; OU both eyes  XT exotropia; ET esotropia; PEK punctate epithelial keratitis; PEE punctate epithelial erosions; DES dry eye syndrome; MGD meibomian gland dysfunction; ATs artificial tears; PFAT's preservative free artificial tears; NSC nuclear sclerotic cataract; PSC posterior subcapsular cataract; ERM epi-retinal membrane; PVD posterior vitreous detachment; RD retinal detachment; DM diabetes mellitus; DR diabetic retinopathy; NPDR non-proliferative diabetic retinopathy; PDR proliferative diabetic retinopathy; CSME clinically significant macular edema; DME diabetic macular edema; dbh dot blot hemorrhages; CWS cotton wool spot; POAG primary open angle glaucoma; C/D cup-to-disc ratio; HVF humphrey visual field; GVF goldmann visual field; OCT optical coherence tomography; IOP intraocular pressure; BRVO Branch retinal vein occlusion; CRVO central retinal vein occlusion; CRAO central retinal artery occlusion; BRAO branch retinal artery occlusion; RT retinal tear; SB scleral buckle; PPV pars plana vitrectomy; VH Vitreous hemorrhage; PRP panretinal laser photocoagulation; IVK intravitreal kenalog; VMT vitreomacular traction; MH Macular hole;  NVD neovascularization of the disc; NVE neovascularization elsewhere; AREDS age related eye disease study; ARMD age related macular degeneration; POAG primary open angle glaucoma; EBMD epithelial/anterior basement membrane dystrophy; ACIOL anterior chamber intraocular lens; IOL intraocular lens; PCIOL posterior chamber  intraocular lens; Phaco/IOL phacoemulsification with intraocular lens placement; PRK photorefractive keratectomy; LASIK laser assisted in situ keratomileusis; HTN hypertension; DM diabetes mellitus; COPD chronic obstructive pulmonary disease

## 2018-04-26 ENCOUNTER — Ambulatory Visit (INDEPENDENT_AMBULATORY_CARE_PROVIDER_SITE_OTHER): Payer: Medicare Other | Admitting: Ophthalmology

## 2018-04-26 ENCOUNTER — Encounter (INDEPENDENT_AMBULATORY_CARE_PROVIDER_SITE_OTHER): Payer: Self-pay | Admitting: Ophthalmology

## 2018-04-26 DIAGNOSIS — H25813 Combined forms of age-related cataract, bilateral: Secondary | ICD-10-CM | POA: Diagnosis not present

## 2018-04-26 DIAGNOSIS — H35352 Cystoid macular degeneration, left eye: Secondary | ICD-10-CM | POA: Diagnosis not present

## 2018-04-26 DIAGNOSIS — H35372 Puckering of macula, left eye: Secondary | ICD-10-CM

## 2018-04-26 DIAGNOSIS — H33199 Other retinoschisis and retinal cysts, unspecified eye: Secondary | ICD-10-CM | POA: Diagnosis not present

## 2018-04-26 DIAGNOSIS — H3581 Retinal edema: Secondary | ICD-10-CM | POA: Diagnosis not present

## 2018-04-26 DIAGNOSIS — H43812 Vitreous degeneration, left eye: Secondary | ICD-10-CM | POA: Diagnosis not present

## 2018-05-07 ENCOUNTER — Institutional Professional Consult (permissible substitution): Payer: Medicare Other | Admitting: Pulmonary Disease

## 2018-05-21 ENCOUNTER — Other Ambulatory Visit: Payer: Self-pay

## 2018-05-21 ENCOUNTER — Encounter: Payer: Self-pay | Admitting: Pulmonary Disease

## 2018-05-21 ENCOUNTER — Ambulatory Visit (INDEPENDENT_AMBULATORY_CARE_PROVIDER_SITE_OTHER): Payer: Medicare Other | Admitting: Pulmonary Disease

## 2018-05-21 VITALS — BP 116/66 | HR 74 | Ht 64.0 in | Wt 120.8 lb

## 2018-05-21 DIAGNOSIS — R0600 Dyspnea, unspecified: Secondary | ICD-10-CM | POA: Diagnosis not present

## 2018-05-21 LAB — CBC WITH DIFFERENTIAL/PLATELET
Basophils Absolute: 0 10*3/uL (ref 0.0–0.1)
Basophils Relative: 0.8 % (ref 0.0–3.0)
EOS ABS: 0.1 10*3/uL (ref 0.0–0.7)
Eosinophils Relative: 2.3 % (ref 0.0–5.0)
HCT: 42.4 % (ref 36.0–46.0)
Hemoglobin: 14.2 g/dL (ref 12.0–15.0)
Lymphocytes Relative: 39.2 % (ref 12.0–46.0)
Lymphs Abs: 1.7 10*3/uL (ref 0.7–4.0)
MCHC: 33.5 g/dL (ref 30.0–36.0)
MCV: 84 fl (ref 78.0–100.0)
Monocytes Absolute: 0.4 10*3/uL (ref 0.1–1.0)
Monocytes Relative: 8.8 % (ref 3.0–12.0)
Neutro Abs: 2.1 10*3/uL (ref 1.4–7.7)
Neutrophils Relative %: 48.9 % (ref 43.0–77.0)
Platelets: 298 10*3/uL (ref 150.0–400.0)
RBC: 5.05 Mil/uL (ref 3.87–5.11)
RDW: 13.4 % (ref 11.5–15.5)
WBC: 4.2 10*3/uL (ref 4.0–10.5)

## 2018-05-21 MED ORDER — UMECLIDINIUM-VILANTEROL 62.5-25 MCG/INH IN AEPB: 1.0000 | INHALATION_SPRAY | Freq: Every day | RESPIRATORY_TRACT | 5 refills | Status: DC

## 2018-05-21 NOTE — Patient Instructions (Signed)
We will give a prescription for Anoro Check CBC differential, IgE and alpha-1 antitrypsin levels and phenotype We will schedule you for pulmonary function test in 3 months and follow-up in clinic visit.

## 2018-05-21 NOTE — Progress Notes (Signed)
Michelle Cook    [id number]    11-27-1950  Primary Care Physician:Murray, Allyne Gee, FNP  Referring Physician: Olive Bass, FNP [address] [place], [place] [address]  Chief complaint: Consult for COPD  HPI: 68 year old with no significant past medical history.  Had episodes of recurrent bronchitis over the past year.  She got a chest x-ray at her primary care showed possible hyperinflation changes consistent with COPD.  She has been referred here for further evaluation  Chief complaint is postnasal drip, nonproductive cough.  No dyspnea or wheezing.  Her symptoms have improved significantly of the past few weeks after initiation of Anoro by her primary care.  She has history of seasonal allergies, no symptoms of acid reflux.  Pets: Dog, no cats, birds, farm animals Occupation: Works as a Metallurgist Exposures: No known exposures, no mold, hot tub, Jacuzzi Smoking history: Never smoker.  Exposed to secondhand smoke as a child Travel history: To [place].  No significant recent travel. Relevant family history: Mother has emphysema Francis Dowse was a smoker]  Outpatient Encounter Medications as of 05/21/2018  Medication Sig  . Cholecalciferol (VITAMIN D PO) Take by mouth.  . fluticasone (FLONASE) 50 MCG/ACT nasal spray OTC FLONASE AS NEEDED.  Marland Kitchen Probiotic Product (PROBIOTIC DAILY) CAPS Take 1 each by mouth daily.  . cetirizine (ZYRTEC) 5 MG tablet Take by mouth.   No facility-administered encounter medications on file as of 05/21/2018.     Allergies as of 05/21/2018 - Review Complete 05/21/2018  Allergen Reaction Noted  . Sulfamethoxazole-trimethoprim  12/06/2008  . Sulfa antibiotics Rash 03/27/2011    Past Medical History:  Diagnosis Date  . Chicken pox   . STD (sexually transmitted disease)    Herpes    Past Surgical History:  Procedure Laterality Date  . TUBAL LIGATION      Family History  Problem Relation Age of Onset  .  COPD Mother   . Heart failure Mother   . Hypertension Mother   . Hypertension Sister   . Hypertension Brother     Social History   Socioeconomic History  . Marital status: Single    Spouse name: Not on file  . Number of children: 2  . Years of education: 19  . Highest education level: Not on file  Occupational History  . Not on file  Social Needs  . Financial resource strain: Not on file  . Food insecurity:    Worry: Not on file    Inability: Not on file  . Transportation needs:    Medical: Not on file    Non-medical: Not on file  Tobacco Use  . Smoking status: Never Smoker  . Smokeless tobacco: Never Used  Substance and Sexual Activity  . Alcohol use: Yes    Alcohol/week: 0.0 standard drinks    Comment: occasionally  . Drug use: No  . Sexual activity: Not Currently    Comment: 1st intercourse 22 yo-1 partner  Lifestyle  . Physical activity:    Days per week: Not on file    Minutes per session: Not on file  . Stress: Not on file  Relationships  . Social connections:    Talks on phone: Not on file    Gets together: Not on file    Attends religious service: Not on file    Active member of club or organization: Not on file    Attends meetings of clubs or organizations: Not on file  Relationship status: Not on file  . Intimate partner violence:    Fear of current or ex partner: Not on file    Emotionally abused: Not on file    Physically abused: Not on file    Forced sexual activity: Not on file  Other Topics Concern  . Not on file  Social History Narrative   Fun: [employer] tennis, [employer]   Denies abuse and feels safe at home.    Looking work part time.    Bake cakes.     Review of systems: Review of Systems  Constitutional: Negative for fever and chills.  HENT: Negative.   Eyes: Negative for blurred vision.  Respiratory: as per HPI  Cardiovascular: Negative for chest pain and palpitations.  Gastrointestinal: Negative for vomiting, diarrhea, blood per  rectum. Genitourinary: Negative for dysuria, urgency, frequency and hematuria.  Musculoskeletal: Negative for myalgias, back pain and joint pain.  Skin: Negative for itching and rash.  Neurological: Negative for dizziness, tremors, focal weakness, seizures and loss of consciousness.  Endo/Heme/Allergies: Negative for environmental allergies.  Psychiatric/Behavioral: Negative for depression, suicidal ideas and hallucinations.  All other systems reviewed and are negative.  Physical Exam: Blood pressure 116/66, pulse 74, height 5\' 4"  (1.626 m), weight 120 lb 12.8 oz (54.8 kg), SpO2 99 %. Gen:      No acute distress HEENT:  EOMI, sclera anicteric Neck:     No masses; no thyromegaly Lungs:    Clear to auscultation bilaterally; normal respiratory effort CV:         Regular rate and rhythm; no murmurs Abd:      + bowel sounds; soft, non-tender; no palpable masses, no distension Ext:    No edema; adequate peripheral perfusion Skin:      Warm and dry; no rash Neuro: alert and oriented x 3 Psych: normal mood and affect  Data Reviewed: Imaging: Chest x-ray 04/19/2018- hyperinflation, no focal infiltrates. Aortic calcifications are present.  No acute abnormality I have reviewed the images personally.  Labs CBC 12/16/2017-WBC 4.7, eos 2.6%, absolute eosinophil count 122  Assessment:  Assessment for COPD Although she has hyperinflation changes on chest x-ray there is no smoking history except for exposure to secondhand smoke. Symptoms appear to have improved with Anoro.  We will continue the same for now Get PFTs when able to evaluate the lungs.  As of now nonelective procedure such as PFTs are on hold due to the COVID pandemic  Check baseline labs including CBC differential, IgE and alpha-1 antitrypsin levels and phenotype.  Plan/Recommendations: - Continue Anoro - PFTs - Check CBC differential, IgE, alpha-1 antitrypsin levels and phenotype  Chilton Greathouse MD [facility] 05/21/2018, 10:28 AM  CC: Olive Bass,*

## 2018-05-26 LAB — ALPHA-1 ANTITRYPSIN PHENOTYPE: A-1 Antitrypsin, Ser: 155 mg/dL (ref 83–199)

## 2018-05-26 LAB — IGE: IgE (Immunoglobulin E), Serum: 29 kU/L (ref <=114)

## 2018-07-06 DIAGNOSIS — R0989 Other specified symptoms and signs involving the circulatory and respiratory systems: Secondary | ICD-10-CM | POA: Diagnosis not present

## 2018-07-06 DIAGNOSIS — H6121 Impacted cerumen, right ear: Secondary | ICD-10-CM | POA: Diagnosis not present

## 2018-07-06 DIAGNOSIS — R6889 Other general symptoms and signs: Secondary | ICD-10-CM | POA: Diagnosis not present

## 2018-08-20 ENCOUNTER — Other Ambulatory Visit: Payer: Self-pay | Admitting: Pulmonary Disease

## 2018-08-21 ENCOUNTER — Other Ambulatory Visit (HOSPITAL_COMMUNITY)
Admission: RE | Admit: 2018-08-21 | Discharge: 2018-08-21 | Disposition: A | Discharge status: Home / Self Care | Payer: Medicare Other | Source: Ambulatory Visit | Attending: Pulmonary Disease | Admitting: Pulmonary Disease

## 2018-08-21 DIAGNOSIS — Z1159 Encounter for screening for other viral diseases: Secondary | ICD-10-CM | POA: Diagnosis not present

## 2018-08-21 LAB — SARS CORONAVIRUS 2 (TAT 6-24 HRS): SARS Coronavirus 2: NEGATIVE

## 2018-08-25 ENCOUNTER — Ambulatory Visit (INDEPENDENT_AMBULATORY_CARE_PROVIDER_SITE_OTHER): Payer: Medicare Other | Admitting: Primary Care

## 2018-08-25 ENCOUNTER — Other Ambulatory Visit: Payer: Self-pay

## 2018-08-25 ENCOUNTER — Encounter: Payer: Self-pay | Admitting: Primary Care

## 2018-08-25 ENCOUNTER — Ambulatory Visit (INDEPENDENT_AMBULATORY_CARE_PROVIDER_SITE_OTHER): Payer: Medicare Other | Admitting: Pulmonary Disease

## 2018-08-25 VITALS — BP 110/68 | HR 65 | Temp 97.9°F | Ht 64.0 in | Wt 127.0 lb

## 2018-08-25 DIAGNOSIS — Z8709 Personal history of other diseases of the respiratory system: Secondary | ICD-10-CM | POA: Diagnosis not present

## 2018-08-25 DIAGNOSIS — R0600 Dyspnea, unspecified: Secondary | ICD-10-CM

## 2018-08-25 DIAGNOSIS — R918 Other nonspecific abnormal finding of lung field: Secondary | ICD-10-CM | POA: Diagnosis not present

## 2018-08-25 LAB — PULMONARY FUNCTION TEST
DL/VA % pred: 81 %
DL/VA: 3.39 ml/min/mmHg/L
DLCO unc % pred: 83 %
DLCO unc: 16.39 ml/min/mmHg
FEF 25-75 Post: 2.18 L/sec
FEF 25-75 Pre: 2.02 L/sec
FEF2575-%Change-Post: 7 %
FEF2575-%Pred-Post: 109 %
FEF2575-%Pred-Pre: 101 %
FEV1-%Change-Post: 1 %
FEV1-%Pred-Post: 106 %
FEV1-%Pred-Pre: 104 %
FEV1-Post: 2.48 L
FEV1-Pre: 2.44 L
FEV1FVC-%Change-Post: 3 %
FEV1FVC-%Pred-Pre: 100 %
FEV6-%Change-Post: -2 %
FEV6-%Pred-Post: 105 %
FEV6-%Pred-Pre: 108 %
FEV6-Post: 3.11 L
FEV6-Pre: 3.18 L
FEV6FVC-%Pred-Post: 104 %
FEV6FVC-%Pred-Pre: 104 %
FVC-%Change-Post: -2 %
FVC-%Pred-Post: 101 %
FVC-%Pred-Pre: 103 %
FVC-Post: 3.11 L
FVC-Pre: 3.18 L
Post FEV1/FVC ratio: 80 %
Post FEV6/FVC ratio: 100 %
Pre FEV1/FVC ratio: 77 %
Pre FEV6/FVC Ratio: 100 %
RV % pred: 92 %
RV: 1.98 L
TLC % pred: 103 %
TLC: 5.25 L

## 2018-08-25 NOTE — Assessment & Plan Note (Addendum)
-   Referred to pulmonary for hyperinflated lungs on CXR and re-current bronchitis  - Patient has never smoked - PFTs today normal with no evidence of COPD - Hyperinflation likely anatomical  - Alpha1 normal  - Follow up as needed or if symptoms worsen

## 2018-08-25 NOTE — Progress Notes (Signed)
@Patient  ID: Michelle Cook, female    DOB: 01/07/1951, 68 y.o.   MRN: [id number]  Chief Complaint  Patient presents with  . Follow-up    Patient reports that she no longer has any sob, but she does have a productive cough, with clear sputum.     Referring provider: Marrian Salvage,*  HPI: 68 year old female, never smoked. PMH significant for hyperlipidemia, recurrent bronchitis. Patient of Dr. Vaughan Browner, seen for initial consult of possible COPD. Chief complaint of PND and np cough. No dyspnea or wheezing. She had CXR at primary care that showed possible hyperinflation changes consistent with COPD. Symptoms improved after starting Anoro. Labs normal.   08/25/2018 Patient presents today for follow-up with PFTs. She is doing well, no complaints. Complains of upper airway cough which she is seeing ENT for. Pulmonary function test today showed no evidence of obstruction or restriction. Denies shortness of breath.   Labs: Alpha 1- 155 (phenotype PI*MM) IgE 29 Eos absolute 100  PFTs 08/25/2018 - FVC 3.11 (101%), FEV1 2.48 (106%), F/F 80 (104%), TLC 103%, DLCO 84%  Allergies  Allergen Reactions  . Sulfamethoxazole-Trimethoprim   . Sulfa Antibiotics Rash    Immunization History  Administered Date(s) Administered  . Influenza, High Dose Seasonal PF 03/31/2018  . Pneumococcal Conjugate-13 03/11/2016  . Td 12/02/2008    Past Medical History:  Diagnosis Date  . Chicken pox   . STD (sexually transmitted disease)    Herpes    Tobacco History: Social History   Tobacco Use  Smoking Status Never Smoker  Smokeless Tobacco Never Used   Counseling given: Not Answered   Outpatient Medications Prior to Visit  Medication Sig Dispense Refill  . Cholecalciferol (VITAMIN D PO) Take by mouth.    . Probiotic Product (PROBIOTIC DAILY) CAPS Take 1 each by mouth daily.    Marland Kitchen umeclidinium-vilanterol (ANORO ELLIPTA) 62.5-25 MCG/INH AEPB Inhale 1 puff into the lungs daily. 60 each 5   . cetirizine (ZYRTEC) 5 MG tablet Take by mouth.    . fluticasone (FLONASE) 50 MCG/ACT nasal spray OTC FLONASE AS NEEDED.     No facility-administered medications prior to visit.    Review of Systems  Review of Systems  Constitutional: Negative.   Respiratory: Positive for cough. Negative for shortness of breath and wheezing.   Cardiovascular: Negative.    Physical Exam  BP 110/68 (BP Location: Left Arm, Cuff Size: Normal)   Pulse 65   Temp 97.9 F (36.6 C) (Oral)   Ht 5\' 4"  (1.626 m)   Wt 127 lb (57.6 kg)   SpO2 99%   BMI 21.80 kg/m  Physical Exam Constitutional:      Appearance: Normal appearance. She is normal weight. She is not ill-appearing.  HENT:     Head: Normocephalic and atraumatic.  Cardiovascular:     Rate and Rhythm: Normal rate and regular rhythm.  Pulmonary:     Effort: Pulmonary effort is normal.     Breath sounds: Normal breath sounds. No wheezing or rhonchi.     Comments: CTA Musculoskeletal:     Comments: Long torso   Neurological:     General: No focal deficit present.     Mental Status: She is alert and oriented to person, place, and time. Mental status is at baseline.  Psychiatric:        Mood and Affect: Mood normal.        Behavior: Behavior normal.        Thought Content: Thought content  normal.        Judgment: Judgment normal.      Lab Results:  CBC    Component Value Date/Time   WBC 4.2 05/21/2018 1051   RBC 5.05 05/21/2018 1051   HGB 14.2 05/21/2018 1051   HCT 42.4 05/21/2018 1051   PLT 298.0 05/21/2018 1051   MCV 84.0 05/21/2018 1051   MCV 88.1 06/30/2012 0933   MCH 27.8 06/30/2012 0933   MCHC 33.5 05/21/2018 1051   RDW 13.4 05/21/2018 1051   LYMPHSABS 1.7 05/21/2018 1051   MONOABS 0.4 05/21/2018 1051   EOSABS 0.1 05/21/2018 1051   BASOSABS 0.0 05/21/2018 1051    BMET    Component Value Date/Time   NA 140 12/16/2017 0919   K 3.6 12/16/2017 0919   CL 105 12/16/2017 0919   CO2 28 12/16/2017 0919   GLUCOSE 98  12/16/2017 0919   BUN 12 12/16/2017 0919   CREATININE 0.97 12/16/2017 0919   CREATININE 0.89 06/30/2012 0930   CALCIUM 9.4 12/16/2017 0919   GFRNONAA 68.26 12/06/2008 0937    BNP No results found for: BNP  ProBNP No results found for: PROBNP  Imaging: No results found.   Assessment & Plan:   Abnormality of lung on chest x-ray - Referred to pulmonary for hyperinflated lungs on CXR and re-current bronchitis  - Patient has never smoked - PFTs today normal with no evidence of COPD - Hyperinflation likely anatomical  - Alpha1 normal  - Follow up as needed or if symptoms worsen   History of bronchitis - Recommend mucinex and flutter valve during episodes of bronchitis - If persistent episodes would get HRCT  - Eosinophils and IgE normal    Glenford BayleyElizabeth W Carlyon Nolasco, NP 08/25/2018

## 2018-08-25 NOTE — Assessment & Plan Note (Addendum)
-   Recommend mucinex and flutter valve during episodes of bronchitis - If persistent episodes would get HRCT  - Eosinophils and IgE normal

## 2018-08-25 NOTE — Progress Notes (Signed)
Full PFT performed today. °

## 2018-08-25 NOTE — Patient Instructions (Addendum)
Testing: Pulmonary function testing looked normal- no evidence of COPD Hyperinflation likely anatomical  Will review with Dr. Vaughan Browner and if any changes to plan will let you know  Recurrent bronchitis: - Recommend mucinex and flutter valve during episodes of bronchitis - If persistent episodes would get high resolution CT scan   Follow-up: - [facility] to [facility] pulmonary as needed only OR if symptoms worsen - Follow up with ENT as instructed

## 2018-09-14 DIAGNOSIS — R0989 Other specified symptoms and signs involving the circulatory and respiratory systems: Secondary | ICD-10-CM | POA: Diagnosis not present

## 2018-09-14 DIAGNOSIS — R1313 Dysphagia, pharyngeal phase: Secondary | ICD-10-CM | POA: Diagnosis not present

## 2018-09-14 DIAGNOSIS — R6889 Other general symptoms and signs: Secondary | ICD-10-CM | POA: Diagnosis not present

## 2018-09-24 DIAGNOSIS — R1310 Dysphagia, unspecified: Secondary | ICD-10-CM | POA: Diagnosis not present

## 2018-09-24 DIAGNOSIS — M2578 Osteophyte, vertebrae: Secondary | ICD-10-CM | POA: Diagnosis not present

## 2018-09-24 DIAGNOSIS — K228 Other specified diseases of esophagus: Secondary | ICD-10-CM | POA: Diagnosis not present

## 2018-12-01 ENCOUNTER — Encounter: Payer: Self-pay | Admitting: Gynecology

## 2018-12-07 DIAGNOSIS — R6889 Other general symptoms and signs: Secondary | ICD-10-CM | POA: Diagnosis not present

## 2018-12-07 DIAGNOSIS — K219 Gastro-esophageal reflux disease without esophagitis: Secondary | ICD-10-CM | POA: Diagnosis not present

## 2018-12-07 DIAGNOSIS — R0989 Other specified symptoms and signs involving the circulatory and respiratory systems: Secondary | ICD-10-CM | POA: Diagnosis not present

## 2018-12-21 ENCOUNTER — Ambulatory Visit (INDEPENDENT_AMBULATORY_CARE_PROVIDER_SITE_OTHER): Payer: Medicare Other | Admitting: *Deleted

## 2018-12-21 ENCOUNTER — Other Ambulatory Visit: Payer: Self-pay

## 2018-12-21 VITALS — BP 124/78 | HR 67 | Resp 17 | Ht 64.0 in | Wt 125.0 lb

## 2018-12-21 DIAGNOSIS — Z0000 Encounter for general adult medical examination without abnormal findings: Secondary | ICD-10-CM

## 2018-12-21 DIAGNOSIS — Z23 Encounter for immunization: Secondary | ICD-10-CM

## 2018-12-21 NOTE — Patient Instructions (Addendum)
Continue doing brain stimulating activities (puzzles, reading, adult coloring books, staying active) to keep memory sharp.   Continue to eat heart healthy diet (full of fruits, vegetables, whole grains, lean protein, water--limit salt, fat, and sugar intake) and increase physical activity as tolerated.   Michelle Cook , Thank you for taking time to come for your Medicare Wellness Visit. I appreciate your ongoing commitment to your health goals. Please review the following plan we discussed and let me know if I can assist you in the future.   These are the goals we discussed: Goals    . Patient Stated     I want to continue to play tennis as long as possible and work with weights to increase my muscle strength.        This is a list of the screening recommended for you and due dates:  Health Maintenance  Topic Date Due  .  Hepatitis C: One time screening is recommended by Center for Disease Control  (CDC) for  adults born from [birth year] through 1965.   1951-01-13  . Flu Shot  09/25/2018  . Tetanus Vaccine  12/03/2018  . Colon Cancer Screening  12/21/2019*  . Pneumonia vaccines (2 of 2 - PPSV23) 12/21/2019*  . Mammogram  11/24/2019  . DEXA scan (bone density measurement)  Completed  *Topic was postponed. The date shown is not the original due date.    Preventive Care 96 Years and Older, Female Preventive care refers to lifestyle choices and visits with your health care provider that can promote health and wellness. This includes:  A yearly physical exam. This is also called an annual well check.  Regular dental and eye exams.  Immunizations.  Screening for certain conditions.  Healthy lifestyle choices, such as diet and exercise. What can I expect for my preventive care visit? Physical exam Your health care provider will check:  Height and weight. These may be used to calculate body mass index (BMI), which is a measurement that tells if you are at a healthy weight.  Heart rate  and blood pressure.  Your skin for abnormal spots. Counseling Your health care provider may ask you questions about:  Alcohol, tobacco, and drug use.  Emotional well-being.  Home and relationship well-being.  Sexual activity.  Eating habits.  History of falls.  Memory and ability to understand (cognition).  Work and work Statistician.  Pregnancy and menstrual history. What immunizations do I need?  Influenza (flu) vaccine  This is recommended every year. Tetanus, diphtheria, and pertussis (Tdap) vaccine  You may need a Td booster every 10 years. Varicella (chickenpox) vaccine  You may need this vaccine if you have not already been vaccinated. Zoster (shingles) vaccine  You may need this after age 57. Pneumococcal conjugate (PCV13) vaccine  One dose is recommended after age 47. Pneumococcal polysaccharide (PPSV23) vaccine  One dose is recommended after age 49. Measles, mumps, and rubella (MMR) vaccine  You may need at least one dose of MMR if you were born in 1957 or later. You may also need a second dose. Meningococcal conjugate (MenACWY) vaccine  You may need this if you have certain conditions. Hepatitis A vaccine  You may need this if you have certain conditions or if you travel or work in places where you may be exposed to hepatitis A. Hepatitis B vaccine  You may need this if you have certain conditions or if you travel or work in places where you may be exposed to hepatitis B. Haemophilus influenzae  type b (Hib) vaccine  You may need this if you have certain conditions. You may receive vaccines as individual doses or as more than one vaccine together in one shot (combination vaccines). Talk with your health care provider about the risks and benefits of combination vaccines. What tests do I need? Blood tests  Lipid and cholesterol levels. These may be checked every 5 years, or more frequently depending on your overall health.  Hepatitis C  test.  Hepatitis B test. Screening  Lung cancer screening. You may have this screening every year starting at age 29 if you have a 30-pack-year history of smoking and currently smoke or have quit within the past 15 years.  Colorectal cancer screening. All adults should have this screening starting at age 53 and continuing until age 99. Your health care provider may recommend screening at age 21 if you are at increased risk. You will have tests every 1-10 years, depending on your results and the type of screening test.  Diabetes screening. This is done by checking your blood sugar (glucose) after you have not eaten for a while (fasting). You may have this done every 1-3 years.  Mammogram. This may be done every 1-2 years. Talk with your health care provider about how often you should have regular mammograms.  BRCA-related cancer screening. This may be done if you have a family history of breast, ovarian, tubal, or peritoneal cancers. Other tests  Sexually transmitted disease (STD) testing.  Bone density scan. This is done to screen for osteoporosis. You may have this done starting at age 65. Follow these instructions at home: Eating and drinking  Eat a diet that includes fresh fruits and vegetables, whole grains, lean protein, and low-fat dairy products. Limit your intake of foods with high amounts of sugar, saturated fats, and salt.  Take vitamin and mineral supplements as recommended by your health care provider.  Do not drink alcohol if your health care provider tells you not to drink.  If you drink alcohol: ? Limit how much you have to 0-1 drink a day. ? Be aware of how much alcohol is in your drink. In the [place] one drink equals one 12 oz bottle of beer (355 mL), one 5 oz glass of wine (148 mL), or one 1 oz glass of hard liquor (44 mL). Lifestyle  Take daily care of your teeth and gums.  Stay active. Exercise for at least 30 minutes on 5 or more days each week.  Do not use  any products that contain nicotine or tobacco, such as cigarettes, e-cigarettes, and chewing tobacco. If you need help quitting, ask your health care provider.  If you are sexually active, practice safe sex. Use a condom or other form of protection in order to prevent STIs (sexually transmitted infections).  Talk with your health care provider about taking a low-dose aspirin or statin. What's next?  Go to your health care provider once a year for a well check visit.  Ask your health care provider how often you should have your eyes and teeth checked.  Stay up to date on all vaccines. This information is not intended to replace advice given to you by your health care provider. Make sure you discuss any questions you have with your health care provider. Document Released: 03/09/2015 Document Revised: 02/04/2018 Document Reviewed: 02/04/2018 Elsevier Patient Education  2020 [employer].

## 2018-12-21 NOTE — Progress Notes (Addendum)
Subjective:   Michelle Cook is a 68 y.o. female who presents for Medicare Annual (Subsequent) preventive examination.  Review of Systems:   Cardiac Risk Factors include: advanced age (>15men, >74 women) Sleep patterns: gets up 1 times nightly to void and sleeps 6-7 hours nightly.    Home Safety/Smoke Alarms: Feels safe in home. Smoke alarms in place.  Living environment; residence and Firearm Safety: 2-story house. Lives alone, no needs for DME, good support system Seat Belt Safety/Bike Helmet: Wears seat belt.      Objective:     Vitals: BP 124/78   Pulse 67   Resp 17   Ht 5\' 4"  (1.626 m)   Wt 125 lb (56.7 kg)   SpO2 99%   BMI 21.46 kg/m   Body mass index is 21.46 kg/m.  Advanced Directives 12/21/2018  Does Patient Have a Medical Advance Directive? Yes  Type of Paramedic of [place];Living will  Copy of [place] in Chart? No - copy requested    Tobacco Social History   Tobacco Use  Smoking Status Never Smoker  Smokeless Tobacco Never Used     Counseling given: Not Answered  Past Medical History:  Diagnosis Date  . Chicken pox   . 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 STD (sexually transmitted disease)    Herpes   Past Surgical History:  Procedure Laterality Date  . TUBAL LIGATION     Family History  Problem Relation Age of Onset  . COPD Mother   . Heart failure Mother   . Hypertension Mother   . Hypertension Sister   . Hypertension Brother    Social History   Socioeconomic History  . Marital status: Single    Spouse name: Not on file  . Number of children: 2  . Years of education: 15  . Highest education level: Not on file  Occupational History  . Occupation: retired  Scientific laboratory technician  . Financial resource strain: Not hard at all  . Food insecurity    Worry: Never true    Inability: Never true  . Transportation needs    Medical: No    Non-medical: No  Tobacco  Use  . Smoking status: Never Smoker  . Smokeless tobacco: Never Used  Substance and Sexual Activity  . Alcohol use: Yes    Alcohol/week: 0.0 standard drinks    Comment: occasionally  . Drug use: No  . Sexual activity: Not Currently    Comment: 1st intercourse 29 yo-1 partner  Lifestyle  . Physical activity    Days per week: 3 days    Minutes per session: 40 min  . Stress: Not at all  Relationships  . Social connections    Talks on phone: More than three times a week    Gets together: More than three times a week    Attends religious service: 1 to 4 times per year    Active member of club or organization: Yes    Attends meetings of clubs or organizations: More than 4 times per year    Relationship status: Divorced  Other Topics Concern  . Not on file  Social History Narrative   Fun: [employer] tennis, [employer]   Denies abuse and feels safe at home.    Looking work part time.    Bake cakes.     Outpatient Encounter Medications as of 12/21/2018  Medication Sig  . Ascorbic Acid (VITAMIN C) 1000 MG  tablet Take 1,000 mg by mouth daily.  . Cholecalciferol (VITAMIN D PO) Take by mouth.  . famotidine (PEPCID) 10 MG tablet Take 10 mg by mouth 2 (two) times daily.  . Probiotic Product (PROBIOTIC DAILY) CAPS Take 1 each by mouth daily.  . [DISCONTINUED] cetirizine (ZYRTEC) 5 MG tablet Take by mouth.  . [DISCONTINUED] fluticasone (FLONASE) 50 MCG/ACT nasal spray OTC FLONASE AS NEEDED.  . [DISCONTINUED] umeclidinium-vilanterol (ANORO ELLIPTA) 62.5-25 MCG/INH AEPB Inhale 1 puff into the lungs daily. (Patient not taking: Reported on 12/21/2018)   No facility-administered encounter medications on file as of 12/21/2018.     Activities of Daily Living In your present state of health, do you have any difficulty performing the following activities: 12/21/2018  Hearing? N  Vision? N  Difficulty concentrating or making decisions? N  Walking or climbing stairs? N  Dressing or bathing? N  Doing  errands, shopping? N  Preparing Food and eating ? N  Using the Toilet? N  In the past six months, have you accidently leaked urine? N  Do you have problems with loss of bowel control? N  Managing your Medications? N  Managing your Finances? N  Housekeeping or managing your Housekeeping? N  Some recent data might be hidden    Patient Care Team: Olive Bass, FNP as PCP - General (Internal Medicine)    Assessment:   This is a routine wellness examination for Michelle Cook. Physical assessment deferred to PCP.   Exercise Activities and Dietary recommendations Current Exercise Habits: Home exercise routine, Type of exercise: walking(tennis), Time (Minutes): 45, Frequency (Times/Week): 3, Weekly Exercise (Minutes/Week): 135, Intensity: Moderate, Exercise limited by: orthopedic condition(s)  Diet (meal preparation, eat out, water intake, caffeinated beverages, dairy products, fruits and vegetables): in general, a "healthy" diet  , well balanced . eats a variety of fruits and vegetables daily, limits salt, fat/cholesterol, sugar,carbohydrates,caffeine, drinks 6-8 glasses of water daily.   Goals    . Patient Stated     I want to continue to play tennis as long as possible and work with weights to increase my muscle strength.        Fall Risk Fall Risk  12/21/2018 12/07/2017  Falls in the past year? 0 No  Number falls in past yr: 0 -  Injury with Fall? 0 -   Depression Screen PHQ 2/9 Scores 12/21/2018 05/29/2015 06/24/2013  PHQ - 2 Score 0 0 0     Cognitive Function       Ad8 score reviewed for issues:  Issues making decisions: no  Less interest in hobbies / activities: no  Repeats questions, stories (family complaining): no  Trouble using ordinary gadgets (microwave, computer, phone):no  Forgets the month or year: no  Mismanaging finances: no  Remembering appts: no  Daily problems with thinking and/or memory: no Ad8 score is= 0   Immunization History   Administered Date(s) Administered  . Fluad Quad(high Dose 65+) 12/21/2018  . Influenza, High Dose Seasonal PF 03/31/2018  . Pneumococcal Conjugate-13 03/11/2016  . Td 12/02/2008   Screening Tests Health Maintenance  Topic Date Due  . Hepatitis C Screening  1950/10/27  . TETANUS/TDAP  12/03/2018  . COLONOSCOPY  12/21/2019 (Originally 07/19/2000)  . PNA vac Low Risk Adult (2 of 2 - PPSV23) 12/21/2019 (Originally 03/11/2017)  . MAMMOGRAM  11/24/2019  . INFLUENZA VACCINE  Completed  . DEXA SCAN  Completed      Plan:     Reviewed health maintenance screenings with patient today and relevant education,  vaccines, and/or referrals were provided.   I have personally reviewed and noted the following in the patient's chart:   . Medical and social history . Use of alcohol, tobacco or illicit drugs  . Current medications and supplements . Functional ability and status . Nutritional status . Physical activity . Advanced directives . List of other physicians . Vitals . Screenings to include cognitive, depression, and falls . Referrals and appointments  In addition, I have reviewed and discussed with patient certain preventive protocols, quality metrics, and best practice recommendations. A written personalized care plan for preventive services as well as general preventive health recommendations were provided to patient.     Wanda PlumpJill A Wine, RN  12/21/2018   Medical screening examination/treatment/procedure(s) were performed by non-physician practitioner and as supervising physician I was immediately available for consultation/collaboration. I agree with above. Sanda Lingerhomas Jones, MD

## 2019-01-25 NOTE — Progress Notes (Signed)
[facility] - Clinic Note  01/26/2019     CHIEF COMPLAINT Patient presents for Retina Follow Up   HISTORY OF PRESENT ILLNESS: Michelle Cook is a 68 y.o. female who presents to the clinic today for:   HPI    Retina Follow Up    In left eye.  This started 6 months ago.  Severity is mild.  Since onset it is stable.  I, the attending physician,  performed the HPI with the patient and updated documentation appropriately.          Comments     6 Months  F/U  ERM.Pt states her vision seems to be blurry while reading at times, denies flashes floaters and ocular pain. Pt is not using gtt's at this time.       Last edited by Rennis ChrisZamora, Khara Renaud, MD on 01/26/2019  8:42 AM. (History)    pt states she has noticed difficulty with focusing while reading small print, she states it doesn't happen all the time, she denies any metamorphopsia   Referring physician: Olivia CanterGroat, Richard Scott, MD [address] [place] [id number]  HISTORICAL INFORMATION:   Selected notes from the MEDICAL RECORD NUMBER Referred by Dr. Laruth BouchardS. Groat for concern of ERM w/ edema OS;  LEE- 05.14.19 (S. Groat) [BCVA OD: 20/25 OS: 20/25] Ocular Hx- cataract OU, hyperopia, presbyopia, ERM w/ edema OS, allergic conjunctivitis OU PMH-     CURRENT MEDICATIONS: No current outpatient medications on file. (Ophthalmic Drugs)   No current facility-administered medications for this visit.  (Ophthalmic Drugs)   Current Outpatient Medications (Other)  Medication Sig  . Ascorbic Acid (VITAMIN C) 1000 MG tablet Take 1,000 mg by mouth daily.  . Cholecalciferol (VITAMIN D PO) Take by mouth.  . famotidine (PEPCID) 10 MG tablet Take 10 mg by mouth 2 (two) times daily.  . Probiotic Product (PROBIOTIC DAILY) CAPS Take 1 each by mouth daily.   No current facility-administered medications for this visit.  (Other)      REVIEW OF SYSTEMS: ROS    Positive for: Eyes   Negative for: Constitutional,  Gastrointestinal, Neurological, Skin, Genitourinary, Musculoskeletal, HENT, Endocrine, Cardiovascular, Respiratory, Psychiatric, Allergic/Imm, Heme/Lymph   Last edited by Eldridge ScotKendrick, Glenda, LPN on 19/1/478212/03/2018  8:42 AM. (History)       ALLERGIES Allergies  Allergen Reactions  . Sulfamethoxazole-Trimethoprim   . Sulfa Antibiotics Rash    PAST MEDICAL HISTORY Past Medical History:  Diagnosis Date  . Chicken pox   . 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 STD (sexually transmitted disease)    Herpes   Past Surgical History:  Procedure Laterality Date  . TUBAL LIGATION      FAMILY HISTORY Family History  Problem Relation Age of Onset  . COPD Mother   . Heart failure Mother   . Hypertension Mother   . Hypertension Sister   . Hypertension Brother     SOCIAL HISTORY Social History   Tobacco Use  . Smoking status: Never Smoker  . Smokeless tobacco: Never Used  Substance Use Topics  . Alcohol use: Yes    Alcohol/week: 0.0 standard drinks    Comment: occasionally  . Drug use: No         OPHTHALMIC EXAM:  Base Eye Exam    Visual Acuity (Snellen - Linear)      Right Left   Dist cc 20/40 -2 20/25 +1   Dist ph cc 20/30 +  2 NI   Correction: Glasses       Tonometry (Tonopen, 8:38 AM)      Right Left   Pressure 12 12       Pupils      Dark Light Shape React APD   Right 4 3 Round Brisk None   Left 4 3 Round Brisk None       Visual Fields (Counting fingers)      Left Right    Full Full       Extraocular Movement      Right Left    Full, Ortho Full, Ortho       Neuro/Psych    Oriented x3: Yes   Mood/Affect: Normal       Dilation    Both eyes: 1.0% Mydriacyl, 2.5% Phenylephrine @ 8:38 AM        Slit Lamp and Fundus Exam    Slit Lamp Exam      Right Left   Lids/Lashes Dermatochalasis - upper lid Dermatochalasis - upper lid   Conjunctiva/Sclera White and quiet White and quiet   Cornea Arcus, trace diffuse Punctate  epithelial erosions, Debris in tear film Arcus, trace diffuse Punctate epithelial erosions, Debris in tear film   Anterior Chamber Deep and quiet Deep and quiet   Iris Round and well dilated Round and well dilated   Lens 3+ Nuclear sclerosis with brunescence, 3+ Cortical cataract 2-3+ Nuclear sclerosis, 3+ Cortical cataract   Vitreous Vitreous syneresis, Posterior vitreous detachment Vitreous syneresis, Posterior vitreous detachment       Fundus Exam      Right Left   Disc Pink and Sharp Pink and Sharp   C/D Ratio 0.3 0.2   Macula Flat, blunted foveal reflex, mild Retinal pigment epithelial mottling, No heme or edema Central Epiretinal membrane with striae/pucker, lamellar hole / pseudo hole with surrounding cystic change / foveoschisis -- stable from prior   Vessels AV crossing changes, mild attenuation, mild Copper wiring Mild Vascular attenuation, AV crossing changes, mild Copper wiring   Periphery Attached Attached          IMAGING AND PROCEDURES  Imaging and Procedures for @TODAY @  OCT, Retina - OU - Both Eyes       Right Eye Quality was good. Central Foveal Thickness: 232. Progression has been stable. Findings include normal foveal contour, no IRF, no SRF.   Left Eye Quality was good. Central Foveal Thickness: 537. Progression has been stable. Findings include abnormal foveal contour, epiretinal membrane, no SRF, macular pucker (No interval change).   Notes *Images captured and stored on drive  Diagnosis / Impression:  OD: NFP; no IRF/SRF OS: ERM w/ foveoschisis and pucker -- no change from prior  Clinical management:  See below  Abbreviations: NFP - Normal foveal profile. CME - cystoid macular edema. PED - pigment epithelial detachment. IRF - intraretinal fluid. SRF - subretinal fluid. EZ - ellipsoid zone. ERM - epiretinal membrane. ORA - outer retinal atrophy. ORT - outer retinal tubulation. SRHM - subretinal hyper-reflective material                  ASSESSMENT/PLAN:    ICD-10-CM   1. 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 Posterior vitreous detachment of left eye  H43.812   4. Cystoid macular edema of left eye  H35.352   5. Combined forms of age-related cataract of both eyes  H25.813   6. Retinal edema  H35.81 OCT, Retina - OU - Both Eyes    1,2. Epiretinal membrane w/ pucker, pseudohole and foveoschisis OS-  - stable from prior -- no new symptoms -- remains asymptomatic; no metamorphopsia  - stable foveoschisis OS -- no CME/leakage on FA  - BCVA 20/25  - recommend monitoring for now due to great [facility] and asymptomatic -- pt in agreement  - F/U 12 months, DFE, OCT  3. PVD / vitreous syneresis OS  - Discussed findings and prognosis  - No RT or RD on 360 peripheral exam  - Reviewed s/s of RT/RD  - Strict return precautions for any such RT/RD signs/symptoms  4. Combined form age-related cataract OU  - The symptoms of cataract, surgical options, and treatments and risks were discussed with patient.  - discussed diagnosis and progression  - not yet visually significant  - monitor for now   Ophthalmic Meds Ordered this visit:  No orders of the defined types were placed in this encounter.      Return in about 1 year (around 01/26/2020) for f/u ERM with pucker OS, DFE, OCT.  There are no Patient Instructions on file for this visit.   Explained the diagnoses, plan, and follow up with the patient and they expressed understanding.  Patient expressed understanding of the importance of proper follow up care.   This document serves as a record of services personally performed by Karie Chimera, MD, PhD. It was created on their behalf by Annalee Genta, COMT. The creation of this record is the provider's dictation and/or activities during the visit.  Electronically signed by: Annalee Genta, COMT 01/26/19 11:24 PM   This document serves as a record of services personally performed by  Karie Chimera, MD, PhD. It was created on their behalf by Laurian Brim, OA, an ophthalmic assistant. The creation of this record is the provider's dictation and/or activities during the visit.    Electronically signed by: Laurian Brim, OA 12.02.2020 11:24 PM   Karie Chimera, M.D., Ph.D. Diseases & Surgery of the Retina and Vitreous [facility] 01/26/2019   I have reviewed the above documentation for accuracy and completeness, and I agree with the above. Karie Chimera, M.D., Ph.D. 01/26/19 11:24 PM    Abbreviations: M myopia (nearsighted); A astigmatism; H hyperopia (farsighted); P presbyopia; Mrx spectacle prescription;  CTL contact lenses; OD right eye; OS left eye; OU both eyes  XT exotropia; ET esotropia; PEK punctate epithelial keratitis; PEE punctate epithelial erosions; DES dry eye syndrome; MGD meibomian gland dysfunction; ATs artificial tears; PFAT's preservative free artificial tears; NSC nuclear sclerotic cataract; PSC posterior subcapsular cataract; ERM epi-retinal membrane; PVD posterior vitreous detachment; RD retinal detachment; DM diabetes mellitus; DR diabetic retinopathy; NPDR non-proliferative diabetic retinopathy; PDR proliferative diabetic retinopathy; CSME clinically significant macular edema; DME diabetic macular edema; dbh dot blot hemorrhages; CWS cotton wool spot; POAG primary open angle glaucoma; C/D cup-to-disc ratio; HVF humphrey visual field; GVF goldmann visual field; OCT optical coherence tomography; IOP intraocular pressure; BRVO Branch retinal vein occlusion; CRVO central retinal vein occlusion; CRAO central retinal artery occlusion; BRAO branch retinal artery occlusion; RT retinal tear; SB scleral buckle; PPV pars plana vitrectomy; VH Vitreous hemorrhage; PRP panretinal laser photocoagulation; IVK intravitreal kenalog; VMT vitreomacular traction; MH Macular hole;  NVD neovascularization of the disc; NVE neovascularization elsewhere; AREDS  age related eye disease study; ARMD age related macular degeneration; POAG primary open angle glaucoma; EBMD epithelial/anterior basement membrane dystrophy; ACIOL anterior chamber intraocular lens; IOL  intraocular lens; PCIOL posterior chamber intraocular lens; Phaco/IOL phacoemulsification with intraocular lens placement; [place] photorefractive keratectomy; LASIK laser assisted in situ keratomileusis; HTN hypertension; DM diabetes mellitus; COPD chronic obstructive pulmonary disease

## 2019-01-26 ENCOUNTER — Encounter (INDEPENDENT_AMBULATORY_CARE_PROVIDER_SITE_OTHER): Payer: Self-pay | Admitting: Ophthalmology

## 2019-01-26 ENCOUNTER — Ambulatory Visit (INDEPENDENT_AMBULATORY_CARE_PROVIDER_SITE_OTHER): Payer: Medicare Other | Admitting: Ophthalmology

## 2019-01-26 DIAGNOSIS — H33199 Other retinoschisis and retinal cysts, unspecified eye: Secondary | ICD-10-CM

## 2019-01-26 DIAGNOSIS — H35372 Puckering of macula, left eye: Secondary | ICD-10-CM

## 2019-01-26 DIAGNOSIS — H25813 Combined forms of age-related cataract, bilateral: Secondary | ICD-10-CM | POA: Diagnosis not present

## 2019-01-26 DIAGNOSIS — H3581 Retinal edema: Secondary | ICD-10-CM

## 2019-01-26 DIAGNOSIS — H43812 Vitreous degeneration, left eye: Secondary | ICD-10-CM

## 2019-01-26 DIAGNOSIS — H35352 Cystoid macular degeneration, left eye: Secondary | ICD-10-CM | POA: Diagnosis not present

## 2019-08-30 ENCOUNTER — Other Ambulatory Visit: Payer: Self-pay | Admitting: Family

## 2019-08-30 DIAGNOSIS — Z1231 Encounter for screening mammogram for malignant neoplasm of breast: Secondary | ICD-10-CM

## 2019-09-08 ENCOUNTER — Ambulatory Visit
Admission: RE | Admit: 2019-09-08 | Discharge: 2019-09-08 | Disposition: A | Discharge status: Home / Self Care | Payer: Medicare Other | Source: Ambulatory Visit | Attending: Family | Admitting: Family

## 2019-09-08 ENCOUNTER — Other Ambulatory Visit: Payer: Self-pay

## 2019-09-08 DIAGNOSIS — Z1231 Encounter for screening mammogram for malignant neoplasm of breast: Secondary | ICD-10-CM | POA: Diagnosis not present

## 2019-09-23 IMAGING — DX DG CHEST 2V
2 series · 2 of 2 positions shown · non-contrast
Comparison: 06/30/2012

CLINICAL DATA: Productive cough for several weeks

EXAM:
CHEST - 2 VIEW

[chest pa]
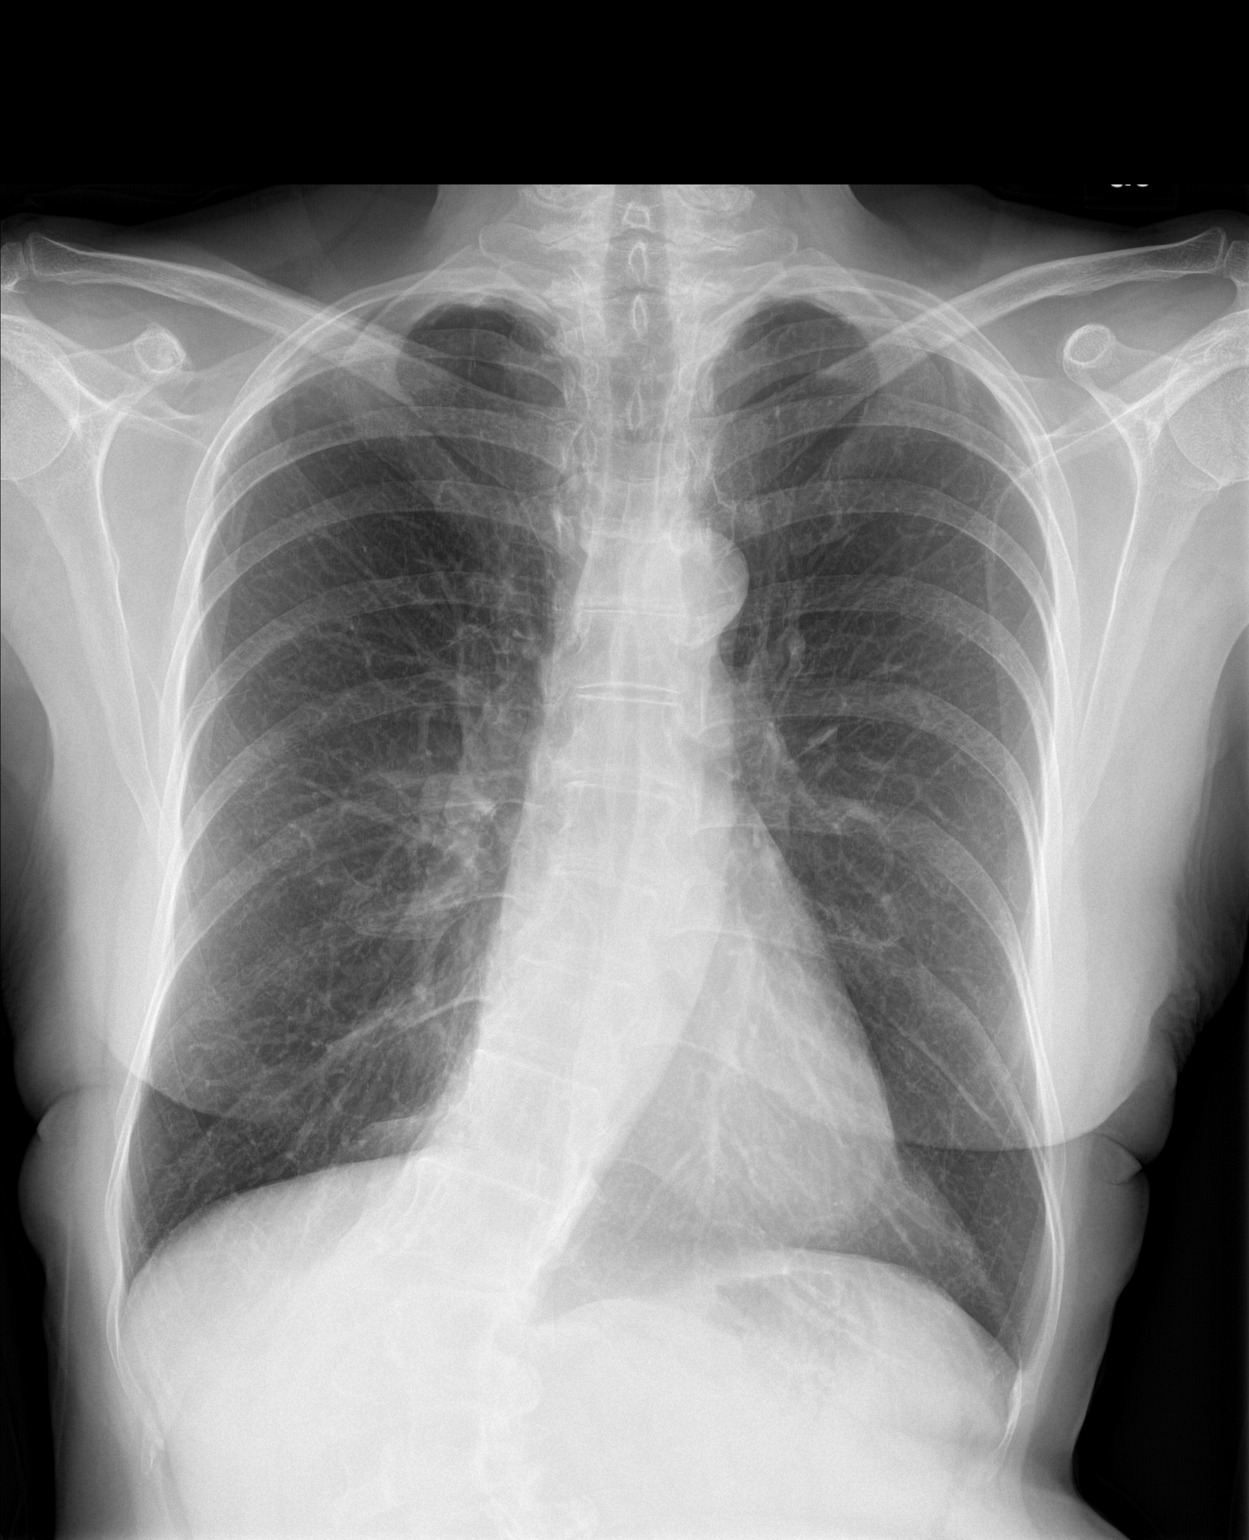

[chest lat]
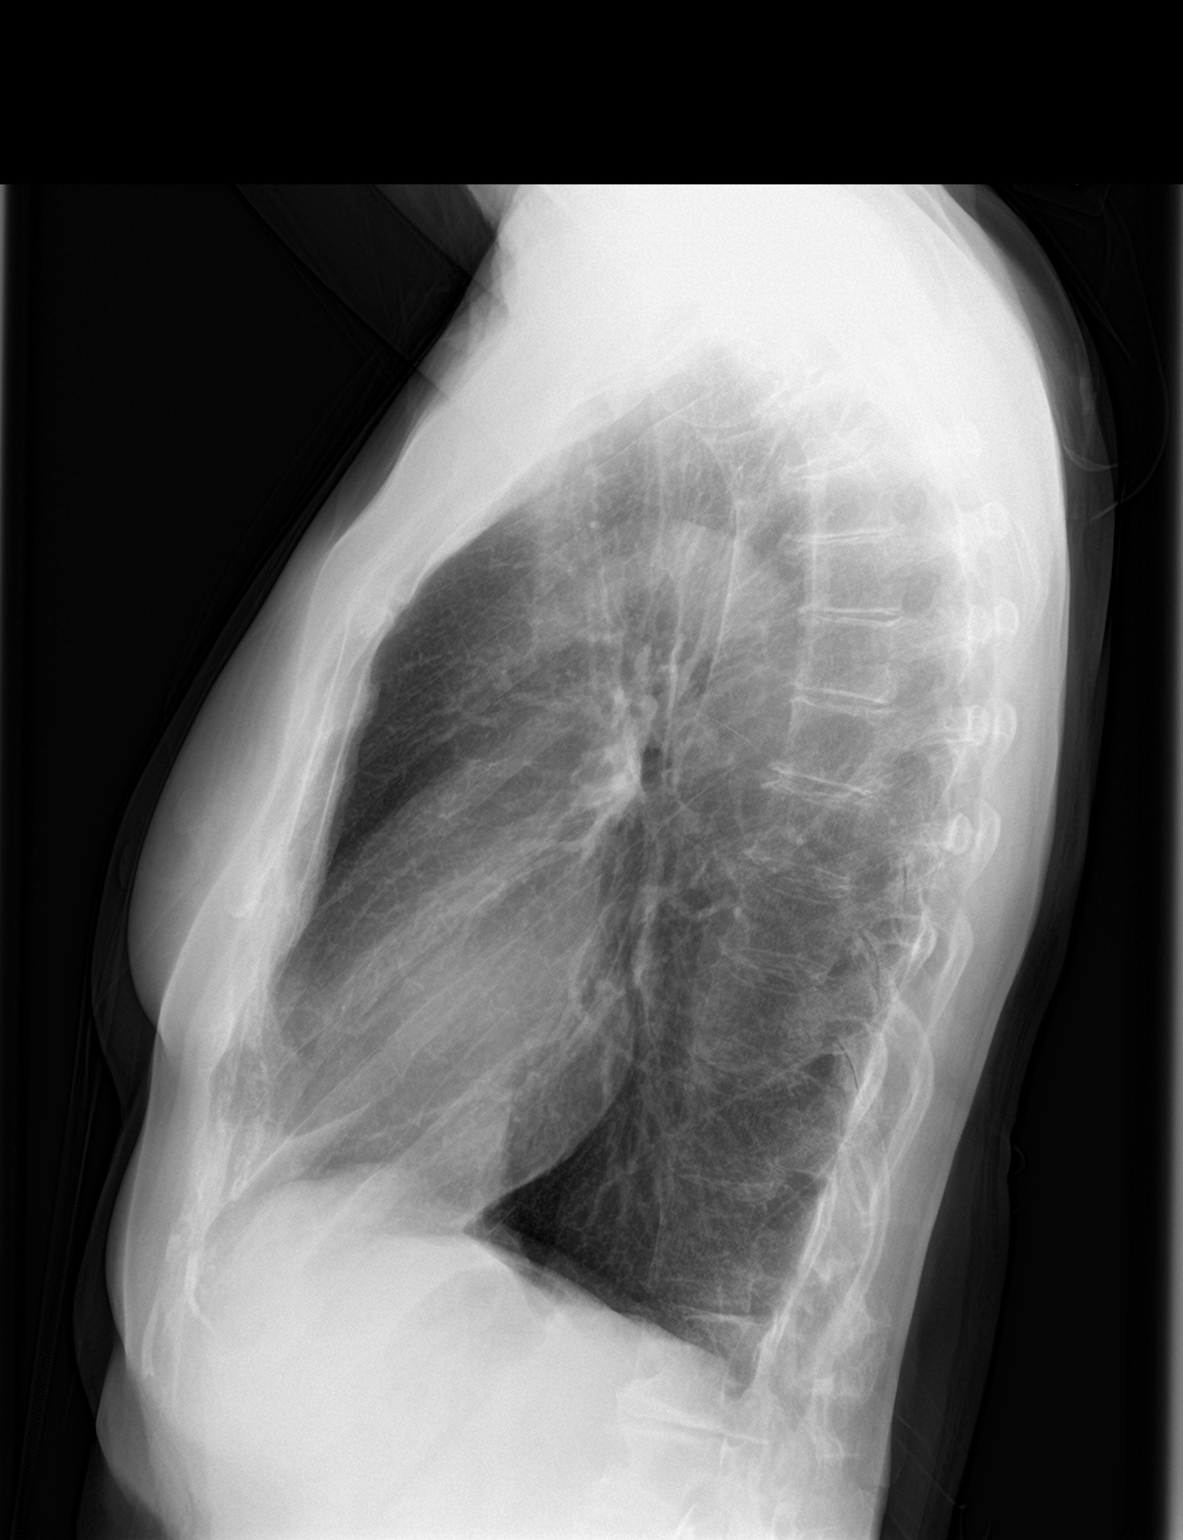

[2 of 2 positions shown; findings below may reference images not displayed]

FINDINGS: Cardiac shadows within normal limits. The lungs are hyperinflated
bilaterally consistent with COPD. No focal infiltrate or sizable
effusion is noted. No acute bony abnormality is seen. Aortic
calcifications are again noted.
IMPRESSION: COPD without acute abnormality.

## 2019-10-10 ENCOUNTER — Ambulatory Visit (INDEPENDENT_AMBULATORY_CARE_PROVIDER_SITE_OTHER): Payer: Medicare Other | Admitting: Obstetrics and Gynecology

## 2019-10-10 ENCOUNTER — Other Ambulatory Visit: Payer: Self-pay

## 2019-10-10 ENCOUNTER — Encounter: Payer: Self-pay | Admitting: Obstetrics and Gynecology

## 2019-10-10 VITALS — BP 118/76 | Ht 63.0 in | Wt 122.0 lb

## 2019-10-10 DIAGNOSIS — N814 Uterovaginal prolapse, unspecified: Secondary | ICD-10-CM | POA: Diagnosis not present

## 2019-10-10 DIAGNOSIS — Z01419 Encounter for gynecological examination (general) (routine) without abnormal findings: Secondary | ICD-10-CM | POA: Diagnosis not present

## 2019-10-10 DIAGNOSIS — R350 Frequency of micturition: Secondary | ICD-10-CM | POA: Diagnosis not present

## 2019-10-10 DIAGNOSIS — Z9289 Personal history of other medical treatment: Secondary | ICD-10-CM | POA: Diagnosis not present

## 2019-10-10 NOTE — Progress Notes (Signed)
Michelle Cook 11-Aug-1950 [id number]  SUBJECTIVE:  69 y.o. G2P2 female for annual routine gynecologic exam. Last seen here in this office in 03/2016. Mainly concerned about possible progression of a cystocele with slight urinary frequency concerns.  Gets up to use restroom once per evening.  Stays physically active, overall the pelvic prolapse is not that bothersome to her.  She has no other gynecologic concerns.  Current Outpatient Medications  Medication Sig Dispense Refill  . Ascorbic Acid (VITAMIN C) 1000 MG tablet Take 1,000 mg by mouth daily.    . Cholecalciferol (VITAMIN D PO) Take by mouth.    . famotidine (PEPCID) 10 MG tablet Take 10 mg by mouth 2 (two) times daily.    . Probiotic Product (PROBIOTIC DAILY) CAPS Take 1 each by mouth daily.     No current facility-administered medications for this visit.   Allergies: Sulfamethoxazole-trimethoprim and Sulfa antibiotics  No LMP recorded. Patient is postmenopausal.  Past medical history,surgical history, problem list, medications, allergies, family history and social history were all reviewed and documented as reviewed in the EPIC chart.  ROS:  Feeling well. No dyspnea or chest pain on exertion.  No abdominal pain, change in bowel habits, black or bloody stools.  + urinary tract frequency. GYN ROS: no abnormal bleeding, pelvic pain or discharge, no breast pain or new or enlarging lumps on self exam.No neurological complaints.   OBJECTIVE:  BP 118/76   Ht 5\' 3"  (1.6 m)   Wt 122 lb (55.3 kg)   BMI 21.61 kg/m  The patient appears well, alert, oriented x 3, in no distress. ENT normal.  Neck supple. No cervical or supraclavicular adenopathy or thyromegaly.  Lungs are clear, good air entry, no wheezes, rhonchi or rales. S1 and S2 normal, no murmurs, regular rate and rhythm.  Abdomen soft without tenderness, guarding, mass or organomegaly.  Neurological is normal, no focal findings.  BREAST EXAM: breasts appear normal, no  suspicious masses, no skin or nipple changes or axillary nodes  PELVIC EXAM: VULVA: normal appearing atrophic vulva with no masses, tenderness or lesions, VAGINA: Grade 2 cystocele with Valsalva, normal appearing atrophic vagina with normal color and discharge, no lesions, CERVIX: Grade 1 cervical/uterine prolapse with Valsalva, normal appearing cervix without discharge or lesions, UTERUS: uterus is normal size, shape, consistency and nontender, ADNEXA: normal adnexa in size, nontender and no masses, RECTAL: normal rectal, no masses  Chaperone: present during the examination  ASSESSMENT:  69 y.o. G2P2 here for annual gynecologic exam  PLAN:   1. Postmenopausal.  No significant hot flashes or night sweats.  Slight occasional sleep disturbance, discussed sleep hygiene practices, she uses melatonin as needed.  No vaginal bleeding. 2. Pap smear 03/2016.  No significant history of abnormal Pap smears.  Discussed option of discontinuing screening with annual Pap smear history and not sexually active and she is comfortable with not continuing screening following the current guidelines. 3. Cystocele/urinary frequency. There appears to have been some progression of the cystocele as she has a second-degree cystocele noted today (noted to be first-degree during her 2018 examination).  She also notes mildly increased urinary frequency, and she is drinking more water to help deal with acid reflux.  We will check baseline Urine culture (very small urine sample).  I encouraged her to follow a timed voiding schedule and to limit caffeine.  We discussed options for dealing with the cystocele to include expectant management, pelvic floor PT, trial of pessary, and surgical options.  Given the  fairly mild symptoms I would recommend either observation or trial of conservative therapy to start.  She will let us know if she wants to proceed with any of those therapies. 4. Mammogram 08/2019.  Normal breast exam  today. Will continue with annual mammograms. 5. Colonoscopy >10 years ago.  This is a common cancer and early diagnosis leads to best outcomes.  I encourage her to schedule a colonoscopy now. 6. Osteopenia.  DEXA 11/2017.  T score -1.8, FRAX 9.6% / 1.5%. She will continue to follow-up with her primary care provider for bone health recommendations. 7. Health maintenance.  No labs today as she normally has these completed elsewhere.  Return annually or sooner, prn.   Michelle Majors MD 10/10/19

## 2019-10-12 LAB — URINE CULTURE
MICRO NUMBER:: 10830659
Result:: NO GROWTH
SPECIMEN QUALITY:: ADEQUATE

## 2019-10-25 DIAGNOSIS — H43812 Vitreous degeneration, left eye: Secondary | ICD-10-CM | POA: Diagnosis not present

## 2019-10-25 DIAGNOSIS — H2512 Age-related nuclear cataract, left eye: Secondary | ICD-10-CM | POA: Diagnosis not present

## 2019-10-25 DIAGNOSIS — H25811 Combined forms of age-related cataract, right eye: Secondary | ICD-10-CM | POA: Diagnosis not present

## 2019-10-25 DIAGNOSIS — H35372 Puckering of macula, left eye: Secondary | ICD-10-CM | POA: Diagnosis not present

## 2019-10-25 DIAGNOSIS — H10413 Chronic giant papillary conjunctivitis, bilateral: Secondary | ICD-10-CM | POA: Diagnosis not present

## 2019-11-01 DIAGNOSIS — H2512 Age-related nuclear cataract, left eye: Secondary | ICD-10-CM | POA: Diagnosis not present

## 2019-11-04 DIAGNOSIS — K219 Gastro-esophageal reflux disease without esophagitis: Secondary | ICD-10-CM | POA: Diagnosis not present

## 2019-11-04 DIAGNOSIS — R0989 Other specified symptoms and signs involving the circulatory and respiratory systems: Secondary | ICD-10-CM | POA: Diagnosis not present

## 2019-12-09 DIAGNOSIS — H52221 Regular astigmatism, right eye: Secondary | ICD-10-CM | POA: Diagnosis not present

## 2019-12-09 DIAGNOSIS — H25811 Combined forms of age-related cataract, right eye: Secondary | ICD-10-CM | POA: Diagnosis not present

## 2019-12-23 DIAGNOSIS — H25812 Combined forms of age-related cataract, left eye: Secondary | ICD-10-CM | POA: Diagnosis not present

## 2020-01-10 DIAGNOSIS — H0265 Xanthelasma of left lower eyelid: Secondary | ICD-10-CM | POA: Diagnosis not present

## 2020-01-10 DIAGNOSIS — H0264 Xanthelasma of left upper eyelid: Secondary | ICD-10-CM | POA: Diagnosis not present

## 2020-01-10 DIAGNOSIS — H0261 Xanthelasma of right upper eyelid: Secondary | ICD-10-CM | POA: Diagnosis not present

## 2020-01-10 DIAGNOSIS — B078 Other viral warts: Secondary | ICD-10-CM | POA: Diagnosis not present

## 2020-01-10 DIAGNOSIS — H0262 Xanthelasma of right lower eyelid: Secondary | ICD-10-CM | POA: Diagnosis not present

## 2020-01-10 DIAGNOSIS — L810 Postinflammatory hyperpigmentation: Secondary | ICD-10-CM | POA: Diagnosis not present

## 2020-01-24 NOTE — Progress Notes (Signed)
[facility] - Clinic Note  01/27/2020     CHIEF COMPLAINT Patient presents for Retina Follow Up   HISTORY OF PRESENT ILLNESS: Michelle Cook is a 69 y.o. female who presents to the clinic today for:   HPI    Retina Follow Up    Patient presents with  Other.  In left eye.  This started 1 year ago.  I, the attending physician,  performed the HPI with the patient and updated documentation appropriately.          Comments    Patient here for 1 year retina follow up for ERM w/ pucker OS. Patient states vision is much better. Had Cat SX OD 1 month ago and OS 2 1/2 weeks ago with Dr Dione Booze. Done with drops.        Last edited by Rennis Chris, MD on 01/27/2020 12:04 PM. (History)    pt has had cataract sx OU with Dr. Zetta Bills, she states she has a toric lens in her right eye, but a standard lens in her left eye, her final post op is next week, she states she is not wearing glasses except for small print  Referring physician: Sallye Lat, MD [address] [place],  [place] [address]  HISTORICAL INFORMATION:   Selected notes from the MEDICAL RECORD NUMBER Referred by Dr. Dione Booze for concern of ERM w/ edema OS;  LEE- 05.14.19 (S. Groat) [BCVA OD: 20/25 OS: 20/25] Ocular Hx- cataract OU, hyperopia, presbyopia, ERM w/ edema OS, allergic conjunctivitis OU PMH-     CURRENT MEDICATIONS: No current outpatient medications on file. (Ophthalmic Drugs)   No current facility-administered medications for this visit. (Ophthalmic Drugs)   Current Outpatient Medications (Other)  Medication Sig   Ascorbic Acid (VITAMIN C) 1000 MG tablet Take 1,000 mg by mouth daily.   Cholecalciferol (VITAMIN D PO) Take by mouth.   famotidine (PEPCID) 10 MG tablet Take 10 mg by mouth 2 (two) times daily.   Probiotic Product (PROBIOTIC DAILY) CAPS Take 1 each by mouth daily.   No current facility-administered medications for this visit. (Other)      REVIEW OF  SYSTEMS: ROS    Positive for: Eyes   Negative for: Constitutional, Gastrointestinal, Neurological, Skin, Genitourinary, Musculoskeletal, HENT, Endocrine, Cardiovascular, Respiratory, Psychiatric, Allergic/Imm, Heme/Lymph   Last edited by Laddie Aquas, COA on 01/27/2020 10:18 AM. (History)       ALLERGIES Allergies  Allergen Reactions   Sulfamethoxazole-Trimethoprim    Sulfa Antibiotics Rash    PAST MEDICAL HISTORY Past Medical History:  Diagnosis Date   Chicken pox    GERD (gastroesophageal reflux disease)    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   STD (sexually transmitted disease)    Herpes   Past Surgical History:  Procedure Laterality Date   TUBAL LIGATION      FAMILY HISTORY Family History  Problem Relation Age of Onset   COPD Mother    Heart failure Mother    Hypertension Mother    Hypertension Sister    Hypertension Brother     SOCIAL HISTORY Social History   Tobacco Use   Smoking status: Never Smoker   Smokeless tobacco: Never Used  Vaping Use   Vaping Use: Never used  Substance Use Topics   Alcohol use: Yes    Alcohol/week: 0.0 standard drinks    Comment: occasionally   Drug use: No  OPHTHALMIC EXAM:  Base Eye Exam    Visual Acuity (Snellen - Linear)      Right Left   Dist [place] 20/20 -2 20/50   Dist ph [place]  20/30 -2       Tonometry (Tonopen, 10:14 AM)      Right Left   Pressure 13 12       Pupils      Dark Light Shape React APD   Right 4 3 Round Brisk None   Left 4 3 Round Brisk None       Visual Fields (Counting fingers)      Left Right    Full Full       Extraocular Movement      Right Left    Full Full       Neuro/Psych    Oriented x3: Yes   Mood/Affect: Normal       Dilation    Both eyes: 1.0% Mydriacyl, 2.5% Phenylephrine @ 10:14 AM        Slit Lamp and Fundus Exam    Slit Lamp Exam      Right Left   Lids/Lashes Dermatochalasis - upper lid  Dermatochalasis - upper lid   Conjunctiva/Sclera White and quiet White and quiet   Cornea Arcus, well healed temporal cataract wounds Arcus, trace diffuse Punctate epithelial erosions, Debris in tear film   Anterior Chamber Deep and quiet Deep and quiet   Iris Round and well dilated Round and well dilated   Lens Toric PC IOL in good position with marks at 0330 and 0930, trace PC folds PC IOL in good position   Vitreous Vitreous syneresis, Posterior vitreous detachment Vitreous syneresis, Posterior vitreous detachment, scattered vitreous condensations       Fundus Exam      Right Left   Disc Pink and Sharp Pink and Sharp   C/D Ratio 0.3 0.2   Macula Flat, blunted foveal reflex, mild Retinal pigment epithelial mottling, No heme or edema Flat, blunted foveal reflex, Central Epiretinal membrane with striae/pucker, lamellar hole / pseudo hole with surrounding cystic change / foveoschisis -- stable from prior   Vessels attenuated, mild tortuousity, mild AV crossing changes, mild Copper wiring attenuated, mild tortuousity, mild AV crossing changes, mild Copper wiring   Periphery Attached Attached          IMAGING AND PROCEDURES  Imaging and Procedures for @TODAY @  OCT, Retina - OU - Both Eyes       Right Eye Quality was good. Central Foveal Thickness: 235. Progression has been stable. Findings include normal foveal contour, no IRF, no SRF.   Left Eye Quality was good. Central Foveal Thickness: 557. Progression has been stable. Findings include abnormal foveal contour, epiretinal membrane, no SRF, macular pucker (No interval change).   Notes *Images captured and stored on drive  Diagnosis / Impression:  OD: NFP; no IRF/SRF OS: ERM w/ foveoschisis and pucker -- no change from prior  Clinical management:  See below  Abbreviations: NFP - Normal foveal profile. CME - cystoid macular edema. PED - pigment epithelial detachment. IRF - intraretinal fluid. SRF - subretinal fluid. EZ -  ellipsoid zone. ERM - epiretinal membrane. ORA - outer retinal atrophy. ORT - outer retinal tubulation. SRHM - subretinal hyper-reflective material                 ASSESSMENT/PLAN:    ICD-10-CM   1. 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 Retinal edema  H35.81 OCT, Retina - OU - Both Eyes  4. Posterior vitreous detachment of left eye  H43.812   5. Pseudophakia, both eyes  Z96.1     1-3. Epiretinal membrane w/ pucker, pseudohole and foveoschisis OS-  - stable from prior -- no new symptoms -- remains asymptomatic; no metamorphopsia  - stable foveoschisis OS -- no CME/leakage on FA (5.30.2019)  - BCVA 20/30  - recommend monitoring for now due to [facility] and asymptomatic -- pt in agreement  - F/U 1 year, DFE, OCT  4. PVD / vitreous syneresis OS  - Discussed findings and prognosis  - No RT or RD on 360 peripheral exam  - Reviewed s/s of RT/RD  - Strict return precautions for any such RT/RD signs/symptoms  5. Pseudophakia OU  - s/p CE/IOL OU (Dr. Zetta Bills, Nov 2021)  - IOLs in good position (OD toric)   - doing well  - monitor    Ophthalmic Meds Ordered this visit:  No orders of the defined types were placed in this encounter.      Return in about 1 year (around 01/26/2021) for f/u ERM OS, DFE, OCT.  There are no Patient Instructions on file for this visit.   Explained the diagnoses, plan, and follow up with the patient and they expressed understanding.  Patient expressed understanding of the importance of proper follow up care.   This document serves as a record of services personally performed by Karie Chimera, MD, PhD. It was created on their behalf by Glee Arvin. Manson Passey, OA an ophthalmic technician. The creation of this record is the provider's dictation and/or activities during the visit.    Electronically signed by: Glee Arvin. Manson Passey, [place] 11.30.2021 12:08 PM   Karie Chimera, M.D., Ph.D. Diseases & Surgery of  the Retina and Vitreous [facility]  I have reviewed the above documentation for accuracy and completeness, and I agree with the above. Karie Chimera, M.D., Ph.D. 01/27/20 12:08 PM   Abbreviations: M myopia (nearsighted); A astigmatism; H hyperopia (farsighted); P presbyopia; Mrx spectacle prescription;  CTL contact lenses; OD right eye; OS left eye; OU both eyes  XT exotropia; ET esotropia; PEK punctate epithelial keratitis; PEE punctate epithelial erosions; DES dry eye syndrome; MGD meibomian gland dysfunction; ATs artificial tears; PFAT's preservative free artificial tears; NSC nuclear sclerotic cataract; PSC posterior subcapsular cataract; ERM epi-retinal membrane; PVD posterior vitreous detachment; RD retinal detachment; DM diabetes mellitus; DR diabetic retinopathy; NPDR non-proliferative diabetic retinopathy; PDR proliferative diabetic retinopathy; CSME clinically significant macular edema; DME diabetic macular edema; dbh dot blot hemorrhages; CWS cotton wool spot; POAG primary open angle glaucoma; C/D cup-to-disc ratio; HVF humphrey visual field; GVF goldmann visual field; OCT optical coherence tomography; IOP intraocular pressure; BRVO Branch retinal vein occlusion; CRVO central retinal vein occlusion; CRAO central retinal artery occlusion; BRAO branch retinal artery occlusion; RT retinal tear; SB scleral buckle; PPV pars plana vitrectomy; VH Vitreous hemorrhage; PRP panretinal laser photocoagulation; IVK intravitreal kenalog; VMT vitreomacular traction; MH Macular hole;  NVD neovascularization of the disc; NVE neovascularization elsewhere; AREDS age related eye disease study; ARMD age related macular degeneration; POAG primary open angle glaucoma; EBMD epithelial/anterior basement membrane dystrophy; ACIOL anterior chamber intraocular lens; IOL intraocular lens; PCIOL posterior chamber intraocular lens; Phaco/IOL phacoemulsification with intraocular lens placement; PRK  photorefractive keratectomy; LASIK laser assisted in situ keratomileusis; HTN hypertension; DM diabetes mellitus; COPD chronic obstructive pulmonary disease

## 2020-01-27 ENCOUNTER — Encounter (INDEPENDENT_AMBULATORY_CARE_PROVIDER_SITE_OTHER): Payer: Self-pay | Admitting: Ophthalmology

## 2020-01-27 ENCOUNTER — Ambulatory Visit (INDEPENDENT_AMBULATORY_CARE_PROVIDER_SITE_OTHER): Payer: Medicare Other | Admitting: Ophthalmology

## 2020-01-27 ENCOUNTER — Other Ambulatory Visit: Payer: Self-pay

## 2020-01-27 DIAGNOSIS — H3581 Retinal edema: Secondary | ICD-10-CM | POA: Diagnosis not present

## 2020-01-27 DIAGNOSIS — H35352 Cystoid macular degeneration, left eye: Secondary | ICD-10-CM

## 2020-01-27 DIAGNOSIS — H35372 Puckering of macula, left eye: Secondary | ICD-10-CM | POA: Diagnosis not present

## 2020-01-27 DIAGNOSIS — H43812 Vitreous degeneration, left eye: Secondary | ICD-10-CM

## 2020-01-27 DIAGNOSIS — H25813 Combined forms of age-related cataract, bilateral: Secondary | ICD-10-CM

## 2020-01-27 DIAGNOSIS — H33199 Other retinoschisis and retinal cysts, unspecified eye: Secondary | ICD-10-CM | POA: Diagnosis not present

## 2020-01-27 DIAGNOSIS — Z961 Presence of intraocular lens: Secondary | ICD-10-CM | POA: Diagnosis not present

## 2020-01-30 DIAGNOSIS — Z23 Encounter for immunization: Secondary | ICD-10-CM | POA: Diagnosis not present

## 2020-05-10 ENCOUNTER — Telehealth: Payer: Self-pay | Admitting: Family

## 2020-05-10 NOTE — Telephone Encounter (Signed)
LVM for pt to rtn my call to schedule AWV with NHA. Please schedule appt if pt calls the office.  

## 2020-05-15 ENCOUNTER — Telehealth: Payer: Self-pay | Admitting: Family

## 2020-05-15 NOTE — Telephone Encounter (Signed)
LVM for pt to rtn my call to schedule AWV with NHA. Please schedule AWV if pt calls the office  

## 2020-05-29 ENCOUNTER — Other Ambulatory Visit: Payer: Self-pay

## 2020-05-29 ENCOUNTER — Ambulatory Visit (INDEPENDENT_AMBULATORY_CARE_PROVIDER_SITE_OTHER): Payer: Medicare Other

## 2020-05-29 DIAGNOSIS — Z0000 Encounter for general adult medical examination without abnormal findings: Secondary | ICD-10-CM | POA: Diagnosis not present

## 2020-05-29 DIAGNOSIS — Z23 Encounter for immunization: Secondary | ICD-10-CM | POA: Diagnosis not present

## 2020-05-29 NOTE — Patient Instructions (Addendum)
Michelle Cook , Thank you for taking time to come for your Medicare Wellness Visit. I appreciate your ongoing commitment to your health goals. Please review the following plan we discussed and let me know if I can assist you in the future.   Screening recommendations/referrals: Colonoscopy: at age 70 (normal) per patient; no record Mammogram: 09/08/2019; due every 1-2 years Bone Density: 12/16/2017; due every 2 years Recommended yearly ophthalmology/optometry visit for glaucoma screening and checkup Recommended yearly dental visit for hygiene and checkup  Vaccinations: Influenza vaccine: 01/30/2020 Pneumococcal vaccine: 03/11/2016; 05/29/2020 Tdap vaccine: 12/02/2008; due every 10 years (overdue) Shingles vaccine: never done   Covid-19: Pfizer 03/24/2019, 2/16/20201, 11/28/2019  Advanced directives: Please bring a copy of your health care power of attorney and living will to the office at your convenience.  Conditions/risks identified: Yes; Reviewed health maintenance screenings with patient today and relevant education, vaccines, and/or referrals were provided. Please continue to do your personal lifestyle choices by: daily care of teeth and gums, regular physical activity (goal should be 5 days a week for 30 minutes), eat a healthy diet, avoid tobacco and drug use, limiting any alcohol intake, taking a low-dose aspirin (if not allergic or have been advised by your provider otherwise) and taking vitamins and minerals as recommended by your provider. Continue doing brain stimulating activities (puzzles, reading, adult coloring books, staying active) to keep memory sharp. Continue to eat heart healthy diet (full of fruits, vegetables, whole grains, lean protein, water--limit salt, fat, and sugar intake) and increase physical activity as tolerated.  Next appointment: Please schedule your next Medicare Wellness Visit with your Nurse Health Advisor in 1 year by calling [phone].  Preventive Care 4  Years and Older, Female Preventive care refers to lifestyle choices and visits with your health care provider that can promote health and wellness. What does preventive care include?  A yearly physical exam. This is also called an annual well check.  Dental exams once or twice a year.  Routine eye exams. Ask your health care provider how often you should have your eyes checked.  Personal lifestyle choices, including:  Daily care of your teeth and gums.  Regular physical activity.  Eating a healthy diet.  Avoiding tobacco and drug use.  Limiting alcohol use.  Practicing safe sex.  Taking low-dose aspirin every day.  Taking vitamin and mineral supplements as recommended by your health care provider. What happens during an annual well check? The services and screenings done by your health care provider during your annual well check will depend on your age, overall health, lifestyle risk factors, and family history of disease. Counseling  Your health care provider may ask you questions about your:  Alcohol use.  Tobacco use.  Drug use.  Emotional well-being.  Home and relationship well-being.  Sexual activity.  Eating habits.  History of falls.  Memory and ability to understand (cognition).  Work and work Astronomer.  Reproductive health. Screening  You may have the following tests or measurements:  Height, weight, and BMI.  Blood pressure.  Lipid and cholesterol levels. These may be checked every 5 years, or more frequently if you are over 68 years old.  Skin check.  Lung cancer screening. You may have this screening every year starting at age 50 if you have a 30-pack-year history of smoking and currently smoke or have quit within the past 15 years.  Fecal occult blood test (FOBT) of the stool. You may have this test every year starting at age 21.  Flexible sigmoidoscopy or colonoscopy. You may have a sigmoidoscopy every 5 years or a colonoscopy every  10 years starting at age 28.  Hepatitis C blood test.  Hepatitis B blood test.  Sexually transmitted disease (STD) testing.  Diabetes screening. This is done by checking your blood sugar (glucose) after you have not eaten for a while (fasting). You may have this done every 1-3 years.  Bone density scan. This is done to screen for osteoporosis. You may have this done starting at age 50.  Mammogram. This may be done every 1-2 years. Talk to your health care provider about how often you should have regular mammograms. Talk with your health care provider about your test results, treatment options, and if necessary, the need for more tests. Vaccines  Your health care provider may recommend certain vaccines, such as:  Influenza vaccine. This is recommended every year.  Tetanus, diphtheria, and acellular pertussis (Tdap, Td) vaccine. You may need a Td booster every 10 years.  Zoster vaccine. You may need this after age 66.  Pneumococcal 13-valent conjugate (PCV13) vaccine. One dose is recommended after age 45.  Pneumococcal polysaccharide (PPSV23) vaccine. One dose is recommended after age 48. Talk to your health care provider about which screenings and vaccines you need and how often you need them. This information is not intended to replace advice given to you by your health care provider. Make sure you discuss any questions you have with your health care provider. Document Released: 03/09/2015 Document Revised: 10/31/2015 Document Reviewed: 12/12/2014 Elsevier Interactive Patient Education  2017 [place] Prevention in the Home Falls can cause injuries. They can happen to people of all ages. There are many things you can do to make your home safe and to help prevent falls. What can I do on the outside of my home?  Regularly fix the edges of walkways and driveways and fix any cracks.  Remove anything that might make you trip as you walk through a door, such as a raised step  or threshold.  Trim any bushes or trees on the path to your home.  Use bright outdoor lighting.  Clear any walking paths of anything that might make someone trip, such as rocks or tools.  Regularly check to see if handrails are loose or broken. Make sure that both sides of any steps have handrails.  Any raised decks and porches should have guardrails on the edges.  Have any leaves, snow, or ice cleared regularly.  Use sand or salt on walking paths during winter.  Clean up any spills in your garage right away. This includes oil or grease spills. What can I do in the bathroom?  Use night lights.  Install grab bars by the toilet and in the tub and shower. Do not use towel bars as grab bars.  Use non-skid mats or decals in the tub or shower.  If you need to sit down in the shower, use a plastic, non-slip stool.  Keep the floor dry. Clean up any water that spills on the floor as soon as it happens.  Remove soap buildup in the tub or shower regularly.  Attach bath mats securely with double-sided non-slip rug tape.  Do not have throw rugs and other things on the floor that can make you trip. What can I do in the bedroom?  Use night lights.  Make sure that you have a light by your bed that is easy to reach.  Do not use any sheets or blankets  that are too big for your bed. They should not hang down onto the floor.  Have a firm chair that has side arms. You can use this for support while you get dressed.  Do not have throw rugs and other things on the floor that can make you trip. What can I do in the kitchen?  Clean up any spills right away.  Avoid walking on wet floors.  Keep items that you use a lot in easy-to-reach places.  If you need to reach something above you, use a strong step stool that has a grab bar.  Keep electrical cords out of the way.  Do not use floor polish or wax that makes floors slippery. If you must use wax, use non-skid floor wax.  Do not have  throw rugs and other things on the floor that can make you trip. What can I do with my stairs?  Do not leave any items on the stairs.  Make sure that there are handrails on both sides of the stairs and use them. Fix handrails that are broken or loose. Make sure that handrails are as long as the stairways.  Check any carpeting to make sure that it is firmly attached to the stairs. Fix any carpet that is loose or worn.  Avoid having throw rugs at the top or bottom of the stairs. If you do have throw rugs, attach them to the floor with carpet tape.  Make sure that you have a light switch at the top of the stairs and the bottom of the stairs. If you do not have them, ask someone to add them for you. What else can I do to help prevent falls?  Wear shoes that:  Do not have high heels.  Have rubber bottoms.  Are comfortable and fit you well.  Are closed at the toe. Do not wear sandals.  If you use a stepladder:  Make sure that it is fully opened. Do not climb a closed stepladder.  Make sure that both sides of the stepladder are locked into place.  Ask someone to hold it for you, if possible.  Clearly mark and make sure that you can see:  Any grab bars or handrails.  First and last steps.  Where the edge of each step is.  Use tools that help you move around (mobility aids) if they are needed. These include:  Canes.  Walkers.  Scooters.  Crutches.  Turn on the lights when you go into a dark area. Replace any light bulbs as soon as they burn out.  Set up your furniture so you have a clear path. Avoid moving your furniture around.  If any of your floors are uneven, fix them.  If there are any pets around you, be aware of where they are.  Review your medicines with your doctor. Some medicines can make you feel dizzy. This can increase your chance of falling. Ask your doctor what other things that you can do to help prevent falls. This information is not intended to  replace advice given to you by your health care provider. Make sure you discuss any questions you have with your health care provider. Document Released: 12/07/2008 Document Revised: 07/19/2015 Document Reviewed: 03/17/2014 Elsevier Interactive Patient Education  2017 [employer].

## 2020-05-29 NOTE — Progress Notes (Addendum)
Subjective:   Michelle Cook is a 70 y.o. female who presents for Medicare Annual (Subsequent) preventive examination.  Review of Systems    No ROS. Medicare Wellness Visit. Additional risk factors are reflected in social history. Cardiac Risk Factors include: advanced age (>3055men, 77>65 women);family history of premature cardiovascular disease     Objective:    Today's Vitals   05/29/20 0812  BP: 118/70  Pulse: 67  Temp: 98 F (36.7 C)  SpO2: 98%  Weight: 122 lb (55.3 kg)  Height: 5\' 3"  (1.6 m)   Body mass index is 21.61 kg/m.  Advanced Directives 05/29/2020 12/21/2018  Does Patient Have a Medical Advance Directive? Yes Yes  Type of Advance Directive Living will;Healthcare Power of [employer] Healthcare Power of [place];Living will  Does patient want to make changes to medical advance directive? No - Patient declined -  Copy of Healthcare Power of Attorney in Chart? No - copy requested No - copy requested    Current Medications (verified) Outpatient Encounter Medications as of 05/29/2020  Medication Sig  . Ascorbic Acid (VITAMIN C) 1000 MG tablet Take 1,000 mg by mouth daily.  . Cholecalciferol (VITAMIN D PO) Take by mouth.  . famotidine (PEPCID) 10 MG tablet Take 10 mg by mouth 2 (two) times daily.  . Probiotic Product (PROBIOTIC DAILY) CAPS Take 1 each by mouth daily.   No facility-administered encounter medications on file as of 05/29/2020.    Allergies (verified) Sulfamethoxazole-trimethoprim and Sulfa antibiotics   History: Past Medical History:  Diagnosis Date  . Chicken pox   . GERD (gastroesophageal reflux disease)   . 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 STD (sexually transmitted disease)    Herpes   Past Surgical History:  Procedure Laterality Date  . TUBAL LIGATION     Family History  Problem Relation Age of Onset  . COPD Mother   . Heart failure Mother   . Hypertension Mother   . Hypertension Sister   .  Hypertension Brother    Social History   Socioeconomic History  . Marital status: Single    Spouse name: Not on file  . Number of children: 2  . Years of education: 7112  . Highest education level: Not on file  Occupational History  . Occupation: retired  Tobacco Use  . Smoking status: Never Smoker  . Smokeless tobacco: Never Used  Vaping Use  . Vaping Use: Never used  Substance and Sexual Activity  . Alcohol use: Yes    Alcohol/week: 0.0 standard drinks    Comment: occasionally  . Drug use: No  . Sexual activity: Not Currently    Comment: 1st intercourse 22 yo-1 partner  Other Topics Concern  . Not on file  Social History Narrative   Fun: [employer] tennis, [employer]   Denies abuse and feels safe at home.    Looking work part time.    Bake cakes.    Social Determinants of Health   Financial Resource Strain: Low Risk   . Difficulty of Paying Living Expenses: Not hard at all  Food Insecurity: No Food Insecurity  . Worried About Programme researcher, broadcasting/film/videounning Out of Food in the Last Year: Never true  . Ran Out of Food in the Last Year: Never true  Transportation Needs: No Transportation Needs  . Lack of Transportation (Medical): No  . Lack of Transportation (Non-Medical): No  Physical Activity: Sufficiently Active  . Days of Exercise per Week: 5 days  .  Minutes of Exercise per Session: 30 min  Stress: No Stress Concern Present  . Feeling of Stress : Not at all  Social Connections: Moderately Integrated  . Frequency of Communication with Friends and Family: More than three times a week  . Frequency of Social Gatherings with Friends and Family: More than three times a week  . Attends Religious Services: More than 4 times per year  . Active Member of Clubs or Organizations: Yes  . Attends Banker Meetings: More than 4 times per year  . Marital Status: Divorced    Tobacco Counseling Counseling given: Not Answered   Clinical Intake:  Pre-visit preparation completed: Yes  Pain :  No/denies pain     BMI - recorded: 21.61 Nutritional Status: BMI of 19-24  Normal Nutritional Risks: None Diabetes: No  How often do you need to have someone help you when you read instructions, pamphlets, or other written materials from your doctor or pharmacy?: 1 - Never What is the last grade level you completed in school?: College; works as IT trainer  Diabetic? no  Interpreter Needed?: No  Information entered by :: Susie Cassette, LPN   Activities of Daily Living In your present state of health, do you have any difficulty performing the following activities: 05/29/2020  Hearing? N  Vision? N  Difficulty concentrating or making decisions? N  Walking or climbing stairs? N  Dressing or bathing? N  Doing errands, shopping? N  Preparing Food and eating ? N  Using the Toilet? N  In the past six months, have you accidently leaked urine? N  Do you have problems with loss of bowel control? N  Managing your Medications? N  Managing your Finances? N  Housekeeping or managing your Housekeeping? N  Some recent data might be hidden    Patient Care Team: Olive Bass, FNP as PCP - General (Internal Medicine)  Indicate any recent Medical Services you may have received from other than [facility] providers in the past year (date may be approximate).     Assessment:   This is a routine wellness examination for Michelle Cook.  Hearing/Vision screen No exam data present  Dietary issues and exercise activities discussed: Current Exercise Habits: Home exercise routine, Type of exercise: Other - see comments;walking (plays tennis), Time (Minutes): 30, Frequency (Times/Week): 5, Weekly Exercise (Minutes/Week): 150, Intensity: Moderate, Exercise limited by: orthopedic condition(s)  Goals    . Patient Stated     I want to continue to play tennis as long as possible and work with weights to increase my muscle strength.       Depression Screen PHQ 2/9 Scores 05/29/2020 12/21/2018 05/29/2015  06/24/2013  PHQ - 2 Score 0 0 0 0    Fall Risk Fall Risk  05/29/2020 12/21/2018 12/07/2017  Falls in the past year? 0 0 No  Number falls in past yr: 0 0 -  Injury with Fall? 0 0 -  Risk for fall due to : No Fall Risks - -  Follow up Falls evaluation completed - -    FALL RISK PREVENTION PERTAINING TO THE HOME:  Any stairs in or around the home? Yes  If so, are there any without handrails? No  Home free of loose throw rugs in walkways, pet beds, electrical cords, etc? Yes  Adequate lighting in your home to reduce risk of falls? Yes   ASSISTIVE DEVICES UTILIZED TO PREVENT FALLS:  Life alert? No  Use of a cane, walker or w/c? No  Grab bars in  the bathroom? No  Shower chair or bench in shower? Yes  Elevated toilet seat or a handicapped toilet? No   TIMED UP AND GO:  Was the test performed? No .  Length of time to ambulate 10 feet: 0 sec.   Gait steady and fast without use of assistive device  Cognitive Function: Normal cognitive status assessed by direct observation by this Nurse Health Advisor. No abnormalities found.          Immunizations Immunization History  Administered Date(s) Administered  . Fluad Quad(high Dose 65+) 12/21/2018  . Influenza, High Dose Seasonal PF 03/31/2018  . PFIZER(Purple Top)SARS-COV-2 Vaccination 03/24/2019, 04/12/2019, 11/28/2019  . Pneumococcal Conjugate-13 03/11/2016  . Td 12/02/2008    TDAP status: Due, Education has been provided regarding the importance of this vaccine. Advised may receive this vaccine at local pharmacy or Health Dept. Aware to provide a copy of the vaccination record if obtained from local pharmacy or Health Dept. Verbalized acceptance and understanding.  Flu Vaccine status: Up to date  Pneumococcal vaccine status: Up to date  Covid-19 vaccine status: Completed vaccines  Qualifies for Shingles Vaccine? Yes   Zostavax completed No   Shingrix Completed?: No.    Education has been provided regarding the importance  of this vaccine. Patient has been advised to call insurance company to determine out of pocket expense if they have not yet received this vaccine. Advised may also receive vaccine at local pharmacy or Health Dept. Verbalized acceptance and understanding.  Screening Tests Health Maintenance  Topic Date Due  . Hepatitis C Screening  Never done  . COLONOSCOPY (Pts 45-80yrs Insurance coverage will need to be confirmed)  Never done  . PNA vac Low Risk Adult (2 of 2 - PPSV23) 03/11/2017  . TETANUS/TDAP  12/03/2018  . INFLUENZA VACCINE  09/24/2020  . MAMMOGRAM  09/07/2021  . DEXA SCAN  Completed  . COVID-19 Vaccine  Completed  . HPV VACCINES  Aged Out    Health Maintenance  Health Maintenance Due  Topic Date Due  . Hepatitis C Screening  Never done  . COLONOSCOPY (Pts 45-26yrs Insurance coverage will need to be confirmed)  Never done  . PNA vac Low Risk Adult (2 of 2 - PPSV23) 03/11/2017  . TETANUS/TDAP  12/03/2018   Colorectal cancer screening: no record on file  Mammogram status: Completed 09/08/2019. Repeat every year  Bone Density status: Completed 12/16/2017. Results reflect: Bone density results: OSTEOPENIA. Repeat every 2 years.  Lung Cancer Screening: (Low Dose CT Chest recommended if Age 48-80 years, 30 pack-year currently smoking OR have quit w/in 15years.) does not qualify.   Lung Cancer Screening Referral: no  Additional Screening:  Hepatitis C Screening: does qualify; Completed no  Vision Screening: Recommended annual ophthalmology exams for early detection of glaucoma and other disorders of the eye. Is the patient up to date with their annual eye exam?  Yes  Who is the provider or what is the name of the office in which the patient attends annual eye exams? Richard Fabian Sharp, MD. If pt is not established with a provider, would they like to be referred to a provider to establish care? No .   Dental Screening: Recommended annual dental exams for proper oral  hygiene  Community Resource Referral / Chronic Care Management: CRR required this visit?  No   CCM required this visit?  No      Plan:     I have personally reviewed and noted the following in the patient's chart:   .  Medical and social history . Use of alcohol, tobacco or illicit drugs  . Current medications and supplements . Functional ability and status . Nutritional status . Physical activity . Advanced directives . List of other physicians . Hospitalizations, surgeries, and ER visits in previous 12 months . Vitals . Screenings to include cognitive, depression, and falls . Referrals and appointments  In addition, I have reviewed and discussed with patient certain preventive protocols, quality metrics, and best practice recommendations. A written personalized care plan for preventive services as well as general preventive health recommendations were provided to patient.     Mickeal Needy, LPN   0/02/270   Nurse Notes:  Medications reviewed with patient; no opioid use noted.   Medical screening examination/treatment/procedure(s) were performed by non-physician practitioner and as supervising provider I was immediately available for consultation/collaboration.  I agree with above. Olive Bass, FNP

## 2020-07-10 ENCOUNTER — Encounter: Payer: Self-pay | Admitting: Family

## 2020-07-20 ENCOUNTER — Encounter: Payer: Self-pay | Admitting: Family

## 2020-07-20 ENCOUNTER — Ambulatory Visit (INDEPENDENT_AMBULATORY_CARE_PROVIDER_SITE_OTHER): Payer: Medicare Other | Admitting: Family

## 2020-07-20 ENCOUNTER — Other Ambulatory Visit: Payer: Self-pay

## 2020-07-20 VITALS — BP 100/62 | HR 66 | Temp 98.7°F | Ht 63.0 in | Wt 117.8 lb

## 2020-07-20 DIAGNOSIS — M8580 Other specified disorders of bone density and structure, unspecified site: Secondary | ICD-10-CM | POA: Diagnosis not present

## 2020-07-20 DIAGNOSIS — Z1211 Encounter for screening for malignant neoplasm of colon: Secondary | ICD-10-CM | POA: Diagnosis not present

## 2020-07-20 DIAGNOSIS — Z1231 Encounter for screening mammogram for malignant neoplasm of breast: Secondary | ICD-10-CM | POA: Diagnosis not present

## 2020-07-20 DIAGNOSIS — Z13220 Encounter for screening for lipoid disorders: Secondary | ICD-10-CM | POA: Diagnosis not present

## 2020-07-20 DIAGNOSIS — E559 Vitamin D deficiency, unspecified: Secondary | ICD-10-CM

## 2020-07-20 DIAGNOSIS — M419 Scoliosis, unspecified: Secondary | ICD-10-CM

## 2020-07-20 DIAGNOSIS — Z1159 Encounter for screening for other viral diseases: Secondary | ICD-10-CM

## 2020-07-20 DIAGNOSIS — E2839 Other primary ovarian failure: Secondary | ICD-10-CM | POA: Diagnosis not present

## 2020-07-20 DIAGNOSIS — G8929 Other chronic pain: Secondary | ICD-10-CM

## 2020-07-20 DIAGNOSIS — M545 Low back pain, unspecified: Secondary | ICD-10-CM

## 2020-07-20 DIAGNOSIS — R634 Abnormal weight loss: Secondary | ICD-10-CM

## 2020-07-20 LAB — COMPREHENSIVE METABOLIC PANEL
ALT: 10 U/L (ref 0–35)
AST: 13 U/L (ref 0–37)
Albumin: 4 g/dL (ref 3.5–5.2)
Alkaline Phosphatase: 92 U/L (ref 39–117)
BUN: 17 mg/dL (ref 6–23)
CO2: 29 mEq/L (ref 19–32)
Calcium: 9.9 mg/dL (ref 8.4–10.5)
Chloride: 104 mEq/L (ref 96–112)
Creatinine, Ser: 1.03 mg/dL (ref 0.40–1.20)
GFR: 55.29 mL/min — ABNORMAL LOW (ref >60.00)
Glucose, Bld: 103 mg/dL — ABNORMAL HIGH (ref 70–99)
Potassium: 4.2 mEq/L (ref 3.5–5.1)
Sodium: 141 mEq/L (ref 135–145)
Total Bilirubin: 0.7 mg/dL (ref 0.2–1.2)
Total Protein: 6.8 g/dL (ref 6.0–8.3)

## 2020-07-20 LAB — CBC WITH DIFFERENTIAL/PLATELET
Basophils Absolute: 0.1 10*3/uL (ref 0.0–0.1)
Basophils Relative: 2.3 % (ref 0.0–3.0)
Eosinophils Absolute: 0.1 10*3/uL (ref 0.0–0.7)
Eosinophils Relative: 2.6 % (ref 0.0–5.0)
HCT: 43 % (ref 36.0–46.0)
Hemoglobin: 14.4 g/dL (ref 12.0–15.0)
Lymphocytes Relative: 42.7 % (ref 12.0–46.0)
Lymphs Abs: 1.8 10*3/uL (ref 0.7–4.0)
MCHC: 33.5 g/dL (ref 30.0–36.0)
MCV: 84.1 fl (ref 78.0–100.0)
Monocytes Absolute: 0.5 10*3/uL (ref 0.1–1.0)
Monocytes Relative: 12.8 % — ABNORMAL HIGH (ref 3.0–12.0)
Neutro Abs: 1.6 10*3/uL (ref 1.4–7.7)
Neutrophils Relative %: 39.6 % — ABNORMAL LOW (ref 43.0–77.0)
Platelets: 419 10*3/uL — ABNORMAL HIGH (ref 150.0–400.0)
RBC: 5.11 Mil/uL (ref 3.87–5.11)
RDW: 13.4 % (ref 11.5–15.5)
WBC: 4.2 10*3/uL (ref 4.0–10.5)

## 2020-07-20 LAB — LIPID PANEL
Cholesterol: 204 mg/dL — ABNORMAL HIGH (ref 0–200)
HDL: 36.3 mg/dL — ABNORMAL LOW (ref >39.00)
LDL Cholesterol: 136 mg/dL — ABNORMAL HIGH (ref 0–99)
NonHDL: 167.76
Total CHOL/HDL Ratio: 6
Triglycerides: 160 mg/dL — ABNORMAL HIGH (ref 0.0–149.0)
VLDL: 32 mg/dL (ref 0.0–40.0)

## 2020-07-20 LAB — VITAMIN D 25 HYDROXY (VIT D DEFICIENCY, FRACTURES): VITD: 24.77 ng/mL — ABNORMAL LOW (ref 30.00–100.00)

## 2020-07-20 LAB — TSH: TSH: 3.39 u[IU]/mL (ref 0.35–4.50)

## 2020-07-20 NOTE — Addendum Note (Signed)
Addended by: Lisbeth Renshaw, Gloriajean Okun HUA on: 07/20/2020 10:34 AM   Modules accepted: Orders

## 2020-07-20 NOTE — Progress Notes (Signed)
Michelle Cook is a 70 y.o. female with the following history as recorded in Epic[employer]:  Patient Active Problem List   Diagnosis Date Noted  . Abnormality of lung on chest x-ray 08/25/2018  . History of bronchitis 08/25/2018  . Medicare annual wellness visit, initial 03/11/2016  . Routine general medical examination at a health care facility 03/11/2016  . Acute bronchitis 01/29/2013  . URI 12/12/2009  . HYPERLIPIDEMIA 12/06/2008    Current Outpatient Medications  Medication Sig Dispense Refill  . Ascorbic Acid (VITAMIN C) 1000 MG tablet Take 1,000 mg by mouth daily.    . Cholecalciferol (VITAMIN D PO) Take by mouth.    . famotidine (PEPCID) 10 MG tablet Take 10 mg by mouth 2 (two) times daily.    . Probiotic Product (PROBIOTIC DAILY) CAPS Take 1 each by mouth daily.     No current facility-administered medications for this visit.    Allergies: Sulfamethoxazole-trimethoprim and Sulfa antibiotics  Past Medical History:  Diagnosis Date  . Chicken pox   . GERD (gastroesophageal reflux disease)   . 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 STD (sexually transmitted disease)    Herpes    Past Surgical History:  Procedure Laterality Date  . TUBAL LIGATION      Family History  Problem Relation Age of Onset  . COPD Mother   . Heart failure Mother   . Hypertension Mother   . Hypertension Sister   . Hypertension Brother     Social History   Tobacco Use  . Smoking status: Never Smoker  . Smokeless tobacco: Never Used  Substance Use Topics  . Alcohol use: Yes    Alcohol/week: 0.0 standard drinks    Comment: occasionally    Subjective:   Presents for yearly follow up on chronic care needs; requesting referral to PT for scoliosis pain; overdue for colonoscopy and DEXA;  Has already seen GYN in August 2021 and had AWV earlier this year;  No acute concerns noted today;  Active- playing tennis regularly;    Objective:  Vitals:   07/20/20 0823   BP: 100/62  Pulse: 66  Temp: 98.7 F (37.1 C)  TempSrc: Oral  SpO2: 99%  Weight: 117 lb 12.8 oz (53.4 kg)  Height: '5\' 3"'  (1.6 m)    General: Well developed, well nourished, in no acute distress  Skin : Warm and dry.  Head: Normocephalic and atraumatic  Eyes: Sclera and conjunctiva clear; pupils round and reactive to light; extraocular movements intact  Ears: External normal; canals clear; tympanic membranes normal  Oropharynx: Pink, supple. No suspicious lesions  Neck: Supple without thyromegaly, adenopathy  Lungs: Respirations unlabored; clear to auscultation bilaterally without wheeze, rales, rhonchi  CVS exam: normal rate and regular rhythm.  Abdomen: Soft; nontender; nondistended; normoactive bowel sounds; no masses or hepatosplenomegaly  Musculoskeletal: No deformities; no active joint inflammation  Extremities: No edema, cyanosis, clubbing  Vessels: Symmetric bilaterally  Neurologic: Alert and oriented; speech intact; face symmetrical; moves all extremities well; CNII-XII intact without focal deficit   Assessment:  1. Chronic low back pain without sciatica, unspecified back pain laterality   2. Scoliosis, unspecified scoliosis type, unspecified spinal region   3. Visit for screening mammogram   4. Osteopenia, unspecified location   5. Ovarian failure   6. Lipid screening   7. Weight loss   8. Vitamin D deficiency   9. Need for hepatitis C screening test   10. Colon cancer screening  Plan:  1. & 2. Refer to PT per patient request;  3. Order for screening mammogram updated; 4. & 5.  Update order for DEXA;  6. Lipid screening updated; 7. Check TSH;  8. Check Vitamin D level yesterday;  9. Check hepatitis C screening; discussed with patient need for this test;  10. Cologuard discussed and ordered for patient;   This visit occurred during the SARS-CoV-2 public health emergency.  Safety protocols were in place, including screening questions prior to the visit,  additional usage of staff PPE, and extensive cleaning of exam room while observing appropriate contact time as indicated for disinfecting solutions.     No follow-ups on file.  Orders Placed This Encounter  Procedures  . MM Digital Screening    Standing Status:   Future    Standing Expiration Date:   07/20/2021    Scheduling Instructions:     Due end of July/ please schedule with DEXA    Order Specific Question:   Reason for Exam (SYMPTOM  OR DIAGNOSIS REQUIRED)    Answer:   screening mammogram    Order Specific Question:   Preferred imaging location?    Answer:   [facility]  . DG Bone Density    Standing Status:   Future    Standing Expiration Date:   07/20/2021    Scheduling Instructions:     Please schedule at same time as screening mammogram    Order Specific Question:   Reason for Exam (SYMPTOM  OR DIAGNOSIS REQUIRED)    Answer:   osteopenia    Order Specific Question:   Preferred imaging location?    Answer:   [facility]  . CBC with Differential/Platelet  . Comp Met (CMET)  . Lipid panel  . TSH  . Vitamin D (25 hydroxy)  . Hepatitis C Antibody  . Ambulatory referral to Physical Therapy    Referral Priority:   Routine    Referral Type:   Physical Medicine    Referral Reason:   Specialty Services Required    Requested Specialty:   Physical Therapy    Number of Visits Requested:   1    Requested Prescriptions    No prescriptions requested or ordered in this encounter

## 2020-07-24 ENCOUNTER — Ambulatory Visit: Payer: Medicare Other | Attending: Family

## 2020-07-24 ENCOUNTER — Other Ambulatory Visit: Payer: Self-pay

## 2020-07-24 DIAGNOSIS — R252 Cramp and spasm: Secondary | ICD-10-CM

## 2020-07-24 DIAGNOSIS — G8929 Other chronic pain: Secondary | ICD-10-CM | POA: Insufficient documentation

## 2020-07-24 DIAGNOSIS — R293 Abnormal posture: Secondary | ICD-10-CM

## 2020-07-24 DIAGNOSIS — M545 Low back pain, unspecified: Secondary | ICD-10-CM | POA: Insufficient documentation

## 2020-07-24 LAB — HEPATITIS C ANTIBODY
Hepatitis C Ab: NONREACTIVE
SIGNAL TO CUT-OFF: 0 (ref <1.00)

## 2020-07-24 NOTE — Therapy (Signed)
[facility] [address], [address] [place], [place], [address] Phone: [phone]   Fax:  [phone]  Physical Therapy Evaluation  Patient Details  Name: Michelle Cook MRN: [id number] Date of Birth: Oct 07, 1950 Referring Provider (PT): Ria ClockMurray, Laura, MD   Encounter Date: 07/24/2020   PT End of Session - 07/24/20 0840    Visit Number 1    Date for PT Re-Evaluation 09/18/20    Authorization Type Medicare    Progress Note Due on Visit 10    PT Start Time 0803    PT Stop Time 0844    PT Time Calculation (min) 41 min    Activity Tolerance Patient tolerated treatment well    Behavior During Therapy [facility] for [facility] assessed/performed           Past Medical History:  Diagnosis Date  . Chicken pox   . GERD (gastroesophageal reflux disease)   . 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 STD (sexually transmitted disease)    Herpes    Past Surgical History:  Procedure Laterality Date  . TUBAL LIGATION      There were no vitals filed for this visit.    Subjective Assessment - 07/24/20 0808    Subjective Pt presents to PT with chronic LBP and scoliosis.  Pt reports a recent limitation in walking distance due to muscle tension and pain.  Pt plays tennis and has difficulty with this.    Pertinent History scoliosis    How long can you walk comfortably? 5-10 minutes and then has lumbar spasms    Diagnostic tests none recent    Patient Stated Goals walk longer, play tennis without pain, reduce lumbar pain    Currently in Pain? Yes    Pain Score 0-No pain   when walking/standing 5/10   Pain Location Back    Pain Descriptors / Indicators Aching;Tightness    Pain Type Chronic pain    Pain Onset More than a month ago    Pain Frequency Intermittent    Aggravating Factors  standing, walking, yardwork    Pain Relieving Factors sit down              [facility] - 07/24/20 0001       Assessment   Medical Diagnosis chronic LBP without sciatica, scoliosis    Referring Provider (PT) Ria ClockMurray, Laura, MD    Onset Date/Surgical Date --   1 year/chronic   Next MD Visit none    Prior Therapy none      Precautions   Precautions None      Restrictions   Weight Bearing Restrictions No      Balance Screen   Has the patient fallen in the past 6 months No    Has the patient had a decrease in activity level because of a fear of falling?  No    Is the patient reluctant to leave their home because of a fear of falling?  No      Home Environment   Living Environment Private residence    Living Arrangements Alone    Type of Home House    Home Access Stairs to enter    Home Layout Able to live on main level with bedroom/bathroom      Prior Function   Level of Independence Independent    Vocation Retired    Leisure tennis, walking, Geneticist, molecularbaking      Cognition   Overall Cognitive  Status Within Functional Limits for tasks assessed      Observation/Other Assessments   Focus on Therapeutic Outcomes (FOTO)  60 (goal is 68)      Posture/Postural Control   Posture/Postural Control Postural limitations    Postural Limitations Forward head;Rounded Shoulders;Posterior pelvic tilt    Posture Comments trunk extension in standing, Rt lumbar convexity, Lt thoracic convexity scoliosis      ROM / Strength   AROM / PROM / Strength AROM;Strength;PROM      AROM   Overall AROM  Within functional limits for tasks performed    Overall AROM Comments lumbar pain with Rt and Lt sidebending      PROM   Overall PROM  Deficits    Overall PROM Comments hip flexibility limited by 25-50% overall      Strength   Overall Strength Deficits    Overall Strength Comments bil hip flexors 4/5, hips 4+/5 to 5/5 and knees 5/5 bilaterally      Palpation   Spinal mobility reduced spinal mobility throughout with pain T8-L3    Palpation comment tension over Rt thoraco lumbar musculature.  Tension in bil  gluteals      Transfers   Transfers Independent with all Transfers      Ambulation/Gait   Gait Pattern Within Functional Limits                      Objective measurements completed on examination: See above findings.               PT Education - 07/24/20 0839    Education Details Access Code: [email or web address]    Person(s) Educated Patient    Methods Explanation;Demonstration;Handout    Comprehension Verbalized understanding;Returned demonstration            PT Short Term Goals - 07/24/20 0801      PT SHORT TERM GOAL #1   Title be independent in initial HEP    Time 4    Period Weeks    Status New    Target Date 08/21/20      PT SHORT TERM GOAL #2   Title tolerate walking for exercise x 15 minutes without limitation due to LBP    Time 4    Period Weeks    Status New    Target Date 08/21/20      PT SHORT TERM GOAL #3   Title report a 30% reduction in LBP with cooking/baking and yardwork    Time 4    Status New    Target Date 08/21/20             PT Long Term Goals - 07/24/20 0801      PT LONG TERM GOAL #1   Title be independent in advanced HEP    Time 8    Period Weeks    Status New    Target Date 09/18/20      PT LONG TERM GOAL #2   Title increase FOTO to > or = to 68 to improve function    Time 8    Period Weeks    Status New    Target Date 09/18/20      PT LONG TERM GOAL #3   Title walk for 30 minutes without limitation due to LBP    Time 8    Period Weeks    Status New    Target Date 09/18/20      PT LONG TERM GOAL #4   Title stand  with neutral standing posture (avoiding lunbar extension) > or = to 75% of the time to improve functional core and hip strength    Time 8    Period Weeks    Status New    Target Date 09/18/20      PT LONG TERM GOAL #5   Title report a 70% redution in LBP with cooking/baking and yardwork    Time 8    Period Weeks    Status New    Target Date 09/18/20      Additional Long Term Goals    Additional Long Term Goals Yes      PT LONG TERM GOAL #6   Title improve functional LE strength to get up/down from the floor with 50% increased ease    Time 8    Period Weeks    Status New    Target Date 09/18/20                  Plan - 07/24/20 0949    Clinical Impression Statement Pt presents to PT with a chronic history of LBP, muscle tension and scoliosis.  Recently, pt has noticed a limitation in the ability to stand and walk >10 minutes due to LBP rated 5/10.  Standing for cooking/baking and yardwork is also limited. Pt plays tennis and reports pain with this. Pt also reports difficulty getting up/down from the floor to play with her grandchildren. Pt would like to learn exercises to improve flexibility and strength in order to improve endurance for exercise and functional tasks. Pt sees a massage therapist regularly to address tissue mobility.  Pt with S shaped scoliosis with Rt lumbar convexity and Lt thoracic convexity.  Pt stand with lumbar hyperextension hanging on anterior pelvic ligaments. Pt is able to correct this with verbal and tactile cueing.  Hip flexibility limited by 25-50% and hip flexor weakness with all other hip and knee strength WFLs.  Pt with palpable tenderness and tension over Lt thoracolumbar musculature and bil gluteals.  Pt will benefit from skilled PT to address tissue mobility, flexibility, strength and postural alignment to improve endurance and tolerance for functional tasks and recreation.    Personal Factors and Comorbidities Comorbidity 1    Comorbidities scoliosis    Examination-Activity Limitations Locomotion Level;Stand    Examination-Participation Restrictions Cleaning;Community Activity;Meal Prep;Laundry;Shop    Stability/Clinical Decision Making Stable/Uncomplicated    Clinical Decision Making Low    Rehab Potential Excellent    PT Frequency 2x / week    PT Duration 8 weeks    PT Treatment/Interventions ADLs/Self Care Home  Management;Cryotherapy;Moist Heat;Electrical Stimulation;Gait training;Functional mobility training;Neuromuscular re-education;Therapeutic exercise;Therapeutic activities;Patient/family education;Manual techniques;Taping;Dry needling;Spinal Manipulations;Joint Manipulations    PT Next Visit Plan DN to multifidi and gluteals, review HEP, postural alignment, lumbopelvic strength    PT Home Exercise Plan Access Code: [email or web address]    Consulted and Agree with Plan of Care Patient           Patient will benefit from skilled therapeutic intervention in order to improve the following deficits and impairments:  Decreased activity tolerance,Pain,Postural dysfunction,Impaired flexibility,Decreased strength,Decreased range of motion,Improper body mechanics,Increased muscle spasms,Decreased endurance  Visit Diagnosis: Chronic bilateral low back pain without sciatica - Plan: PT plan of care cert/re-cert  Abnormal posture - Plan: PT plan of care cert/re-cert  Cramp and spasm - Plan: PT plan of care cert/re-cert     Problem List Patient Active Problem List   Diagnosis Date Noted  . Abnormality of lung on chest x-ray 08/25/2018  .  History of bronchitis 08/25/2018  . Medicare annual wellness visit, initial 03/11/2016  . Routine general medical examination at a health care facility 03/11/2016  . Acute bronchitis 01/29/2013  . URI 12/12/2009  . HYPERLIPIDEMIA 12/06/2008     Lorrene Reid, PT 07/24/20 9:55 AM  [facility] [address], [address] [place], [place], [address] Phone: [phone]   Fax:  [phone]  Name: Michelle Cook MRN: [id number] Date of Birth: 03-09-1950

## 2020-07-24 NOTE — Patient Instructions (Signed)
Access Code: [email or web address] URL: [email or web address] Date: 07/24/2020 Prepared by: Tresa Endo  Exercises Supine Lower Trunk Rotation - 3 x daily - 7 x weekly - 1 sets - 3 reps - 20 hold TL Sidebending Stretch - Single Arm Overhead - 2 x daily - 7 x weekly - 1 sets - 5 reps - 10 hold Clamshell - 2 x daily - 7 x weekly - 2 sets - 10 reps Seated Figure 4 Piriformis Stretch - 2 x daily - 7 x weekly - 1 sets - 3 reps - 20 hold Seated Hamstring Stretch - 2 x daily - 7 x weekly - 1 sets - 3 reps - 20 hold Standing Postural Correction with Compression Garment - 1 x daily - 7 x weekly - 3 sets - 10 reps Correct Standing Posture - 1 x daily - 7 x weekly - 1 sets - 10 reps

## 2020-07-26 ENCOUNTER — Ambulatory Visit: Payer: Medicare Other | Attending: Family

## 2020-07-26 ENCOUNTER — Other Ambulatory Visit: Payer: Self-pay

## 2020-07-26 DIAGNOSIS — R293 Abnormal posture: Secondary | ICD-10-CM | POA: Diagnosis not present

## 2020-07-26 DIAGNOSIS — R252 Cramp and spasm: Secondary | ICD-10-CM | POA: Diagnosis not present

## 2020-07-26 DIAGNOSIS — M545 Low back pain, unspecified: Secondary | ICD-10-CM | POA: Insufficient documentation

## 2020-07-26 DIAGNOSIS — G8929 Other chronic pain: Secondary | ICD-10-CM | POA: Diagnosis not present

## 2020-07-26 NOTE — Therapy (Signed)
[facility] [address], [address] [place], [place], [address] Phone: [phone]   Fax:  [phone]  Physical Therapy Treatment  Patient Details  Name: ABIOLA BEHRING MRN: [id number] Date of Birth: [birth year] Referring Provider (PT): Ria Clock, MD   Encounter Date: 07/26/2020   PT End of Session - 07/26/20 0840    Visit Number 2    Date for PT Re-Evaluation 09/18/20    Authorization Type Medicare    Progress Note Due on Visit 10    PT Start Time 0801    PT Stop Time 0843    PT Time Calculation (min) 42 min    Activity Tolerance Patient tolerated treatment well    Behavior During Therapy [facility] for tasks assessed/performed           Past Medical History:  Diagnosis Date  . Chicken pox   . GERD (gastroesophageal reflux disease)   . 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 STD (sexually transmitted disease)    Herpes    Past Surgical History:  Procedure Laterality Date  . TUBAL LIGATION      There were no vitals filed for this visit.   Subjective Assessment - 07/26/20 0800    Subjective I'm doing OK with stretching at home.    Currently in Pain? No/denies                             [facility] Adult PT Treatment/Exercise - 07/26/20 0001      Exercises   Exercises Knee/Hip;Lumbar      Lumbar Exercises: Stretches   Active Hamstring Stretch Left;Right;2 reps;20 seconds    Lower Trunk Rotation 2 reps;20 seconds    Piriformis Stretch 2 reps;Left;Right      Lumbar Exercises: Sidelying   Clam 20 reps;Both;Left      Manual Therapy   Manual Therapy Soft tissue mobilization;Myofascial release    Manual therapy comments skilled palpation and assessment performed during dry needling    Soft tissue mobilization elongation and release to bil lumbar spine and gluteals            Trigger Point Dry Needling - 07/26/20 0001    Consent Given? Yes    Education  Handout Provided Previously provided    Muscles Treated Back/Hip Lumbar multifidi;Gluteus maximus;Gluteus medius;Gluteus minimus    Gluteus Minimus Response Twitch response elicited;Palpable increased muscle length    Gluteus Medius Response Twitch response elicited;Palpable increased muscle length    Gluteus Maximus Response Twitch response elicited;Palpable increased muscle length    Lumbar multifidi Response Twitch response elicited;Palpable increased muscle length                  PT Short Term Goals - 07/24/20 0801      PT SHORT TERM GOAL #1   Title be independent in initial HEP    Time 4    Period Weeks    Status New    Target Date 08/21/20      PT SHORT TERM GOAL #2   Title tolerate walking for exercise x 15 minutes without limitation due to LBP    Time 4    Period Weeks    Status New    Target Date 08/21/20      PT SHORT TERM GOAL #3   Title report a 30% reduction in LBP with cooking/baking and yardwork    Time 4  Status New    Target Date 08/21/20             PT Long Term Goals - 07/24/20 0801      PT LONG TERM GOAL #1   Title be independent in advanced HEP    Time 8    Period Weeks    Status New    Target Date 09/18/20      PT LONG TERM GOAL #2   Title increase FOTO to > or = to 68 to improve function    Time 8    Period Weeks    Status New    Target Date 09/18/20      PT LONG TERM GOAL #3   Title walk for 30 minutes without limitation due to LBP    Time 8    Period Weeks    Status New    Target Date 09/18/20      PT LONG TERM GOAL #4   Title stand with neutral standing posture (avoiding lunbar extension) > or = to 75% of the time to improve functional core and hip strength    Time 8    Period Weeks    Status New    Target Date 09/18/20      PT LONG TERM GOAL #5   Title report a 70% redution in LBP with cooking/baking and yardwork    Time 8    Period Weeks    Status New    Target Date 09/18/20      Additional Long Term Goals    Additional Long Term Goals Yes      PT LONG TERM GOAL #6   Title improve functional LE strength to get up/down from the floor with 50% increased ease    Time 8    Period Weeks    Status New    Target Date 09/18/20                 Plan - 07/26/20 0814    Clinical Impression Statement First time follow-up after evaluation today.  Pt is independent and compliant in HEP issued last session.  Pt with tension and trigger points in the lumbar spine and gluteals.  Pt with good response to manual therapy and dry needling with multiple twitch responses.  Pt demonstrated improved tissue mobility and reduced tension after manual therapy today.  Pt will continue to benefit from skilled PT to address posture, lumbopelvic strength and manual to address tissue mobility.    PT Frequency 2x / week    PT Duration 8 weeks    PT Treatment/Interventions ADLs/Self Care Home Management;Cryotherapy;Moist Heat;Electrical Stimulation;Gait training;Functional mobility training;Neuromuscular re-education;Therapeutic exercise;Therapeutic activities;Patient/family education;Manual techniques;Taping;Dry needling;Spinal Manipulations;Joint Manipulations    PT Next Visit Plan assess response to DN to multifidi and gluteals, postural alignment, lumbopelvic strength    PT Home Exercise Plan Access Code: [email or web address]    Recommended Other Services initial cert is signed    Consulted and Agree with Plan of Care Patient           Patient will benefit from skilled therapeutic intervention in order to improve the following deficits and impairments:  Decreased activity tolerance,Pain,Postural dysfunction,Impaired flexibility,Decreased strength,Decreased range of motion,Improper body mechanics,Increased muscle spasms,Decreased endurance  Visit Diagnosis: Chronic bilateral low back pain without sciatica  Abnormal posture  Cramp and spasm     Problem List Patient Active Problem List   Diagnosis Date Noted  .  Abnormality of lung on chest x-ray 08/25/2018  . History of bronchitis 08/25/2018  .  Medicare annual wellness visit, initial 03/11/2016  . Routine general medical examination at a health care facility 03/11/2016  . Acute bronchitis 01/29/2013  . URI 12/12/2009  . HYPERLIPIDEMIA 12/06/2008    Lorrene Reid, PT 07/26/20 8:45 AM  [facility] [address] [place], [place], [address] Phone: [phone]   Fax:  [phone]  Name: MADELYNNE LASKER MRN: [id number] Date of Birth: October 06, 1950

## 2020-07-30 NOTE — Progress Notes (Signed)
Left message on machine to call back  

## 2020-08-06 ENCOUNTER — Other Ambulatory Visit: Payer: Self-pay

## 2020-08-06 ENCOUNTER — Ambulatory Visit: Payer: Medicare Other

## 2020-08-06 DIAGNOSIS — R293 Abnormal posture: Secondary | ICD-10-CM

## 2020-08-06 DIAGNOSIS — M545 Low back pain, unspecified: Secondary | ICD-10-CM | POA: Diagnosis not present

## 2020-08-06 DIAGNOSIS — G8929 Other chronic pain: Secondary | ICD-10-CM | POA: Diagnosis not present

## 2020-08-06 DIAGNOSIS — R252 Cramp and spasm: Secondary | ICD-10-CM

## 2020-08-06 NOTE — Therapy (Signed)
[facility] [address] [place], [place], [address] Phone: [phone]   Fax:  [phone]  Physical Therapy Treatment  Patient Details  Name: Michelle Cook MRN: [id number] Date of Birth: 05/25/1950 Referring Provider (PT): Ria Clock, MD   Encounter Date: 08/06/2020   PT End of Session - 08/06/20 1226     Visit Number 3    Date for PT Re-Evaluation 09/18/20    Authorization Type Medicare    Progress Note Due on Visit 10    PT Start Time 1145    PT Stop Time 1226    PT Time Calculation (min) 41 min    Activity Tolerance Patient tolerated treatment well    Behavior During Therapy [facility] for tasks assessed/performed             Past Medical History:  Diagnosis Date   Chicken pox    GERD (gastroesophageal reflux disease)    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   Subjective Assessment - 08/06/20 1144     Subjective It was a long 10 days of travel.  I wasn't good about getting my exercises done as prescribed because my grandkids were there.    Currently in Pain? No/denies                               [facility] Adult PT Treatment/Exercise - 08/06/20 0001       Lumbar Exercises: Stretches   Active Hamstring Stretch Left;Right;2 reps;20 seconds    Lower Trunk Rotation 2 reps;20 seconds    Piriformis Stretch 2 reps;Left;Right      Lumbar Exercises: Sidelying   Clam 20 reps;Both;Left      Manual Therapy   Manual Therapy Soft tissue mobilization;Myofascial release    Manual therapy comments skilled palpation and assessment performed during dry needling    Soft tissue mobilization elongation and release to bil lumbar spine and gluteals              Trigger Point Dry Needling -  08/06/20 0001     Consent Given? Yes    Education Handout Provided Previously provided    Muscles Treated Back/Hip Lumbar multifidi;Gluteus maximus;Gluteus medius;Gluteus minimus    Gluteus Minimus Response Twitch response elicited;Palpable increased muscle length    Gluteus Medius Response Twitch response elicited;Palpable increased muscle length    Gluteus Maximus Response Twitch response elicited;Palpable increased muscle length    Lumbar multifidi Response Twitch response elicited;Palpable increased muscle length                    PT Short Term Goals - 07/24/20 0801       PT SHORT TERM GOAL #1   Title be independent in initial HEP    Time 4    Period Weeks    Status New    Target Date 08/21/20      PT SHORT TERM GOAL #2   Title tolerate walking for exercise x 15 minutes without limitation due to LBP    Time 4    Period Weeks    Status New    Target Date 08/21/20      PT SHORT TERM GOAL #3  Title report a 30% reduction in LBP with cooking/baking and yardwork    Time 4    Status New    Target Date 08/21/20               PT Long Term Goals - 07/24/20 0801       PT LONG TERM GOAL #1   Title be independent in advanced HEP    Time 8    Period Weeks    Status New    Target Date 09/18/20      PT LONG TERM GOAL #2   Title increase FOTO to > or = to 68 to improve function    Time 8    Period Weeks    Status New    Target Date 09/18/20      PT LONG TERM GOAL #3   Title walk for 30 minutes without limitation due to LBP    Time 8    Period Weeks    Status New    Target Date 09/18/20      PT LONG TERM GOAL #4   Title stand with neutral standing posture (avoiding lunbar extension) > or = to 75% of the time to improve functional core and hip strength    Time 8    Period Weeks    Status New    Target Date 09/18/20      PT LONG TERM GOAL #5   Title report a 70% redution in LBP with cooking/baking and yardwork    Time 8    Period Weeks     Status New    Target Date 09/18/20      Additional Long Term Goals   Additional Long Term Goals Yes      PT LONG TERM GOAL #6   Title improve functional LE strength to get up/down from the floor with 50% increased ease    Time 8    Period Weeks    Status New    Target Date 09/18/20                   Plan - 08/06/20 1202     Clinical Impression Statement Pt traveled last week and was not able to be as compliant with HEP due to this. PT reviewed HEP again and will add to HEP next session to include scapular strength and core strength.   Pt with tension and trigger points in the lumbar spine and gluteals.  Pt with good response to manual therapy and dry needling with multiple twitch responses.  Pt demonstrated improved tissue mobility and reduced tension after manual therapy today.  Pt will continue to benefit from skilled PT to address posture, lumbopelvic strength and manual to address tissue mobility.    PT Frequency 2x / week    PT Duration 8 weeks    PT Treatment/Interventions ADLs/Self Care Home Management;Cryotherapy;Moist Heat;Electrical Stimulation;Gait training;Functional mobility training;Neuromuscular re-education;Therapeutic exercise;Therapeutic activities;Patient/family education;Manual techniques;Taping;Dry needling;Spinal Manipulations;Joint Manipulations    PT Next Visit Plan assess response to DN to multifidi and gluteals, postural alignment, lumbopelvic strength- add to HEP    PT Home Exercise Plan Access Code: [email or web address]    Consulted and Agree with Plan of Care Patient             Patient will benefit from skilled therapeutic intervention in order to improve the following deficits and impairments:  Decreased activity tolerance, Pain, Postural dysfunction, Impaired flexibility, Decreased strength, Decreased range of motion, Improper body mechanics, Increased muscle spasms, Decreased endurance  Visit Diagnosis: Chronic bilateral low back pain without  sciatica  Abnormal posture  Cramp and spasm     Problem List Patient Active Problem List   Diagnosis Date Noted   Abnormality of lung on chest x-ray 08/25/2018   History of bronchitis 08/25/2018   Medicare annual wellness visit, initial 03/11/2016   Routine general medical examination at a health care facility 03/11/2016   Acute bronchitis 01/29/2013   URI 12/12/2009   HYPERLIPIDEMIA 12/06/2008   Lorrene Reid, PT 08/06/20 12:27 PM   [facility] [address] [place], [place], [address] Phone: [phone]   Fax:  [phone]  Name: Michelle Cook MRN: [id number] Date of Birth: September 01, 1950

## 2020-08-07 DIAGNOSIS — Z1211 Encounter for screening for malignant neoplasm of colon: Secondary | ICD-10-CM | POA: Diagnosis not present

## 2020-08-08 ENCOUNTER — Ambulatory Visit: Payer: Medicare Other

## 2020-08-08 ENCOUNTER — Other Ambulatory Visit: Payer: Self-pay

## 2020-08-08 DIAGNOSIS — R252 Cramp and spasm: Secondary | ICD-10-CM | POA: Diagnosis not present

## 2020-08-08 DIAGNOSIS — G8929 Other chronic pain: Secondary | ICD-10-CM

## 2020-08-08 DIAGNOSIS — R293 Abnormal posture: Secondary | ICD-10-CM

## 2020-08-08 DIAGNOSIS — M545 Low back pain, unspecified: Secondary | ICD-10-CM | POA: Diagnosis not present

## 2020-08-08 NOTE — Therapy (Signed)
[facility] [address] [place], [place], [address] Phone: [phone]   Fax:  [phone]  Physical Therapy Treatment  Patient Details  Name: Michelle Cook MRN: [id number] Date of Birth: 10-Mar-1950 Referring Provider (PT): Ria Clock, MD   Encounter Date: 08/08/2020   PT End of Session - 08/08/20 0932     Visit Number 4    Date for PT Re-Evaluation 09/18/20    Authorization Type Medicare    Progress Note Due on Visit 10    PT Start Time 0848    PT Stop Time 0929    PT Time Calculation (min) 41 min    Activity Tolerance Patient tolerated treatment well    Behavior During Therapy [facility] for tasks assessed/performed             Past Medical History:  Diagnosis Date   Chicken pox    GERD (gastroesophageal reflux disease)    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   Subjective Assessment - 08/08/20 0851     Subjective My back feels tired. No pain.    Patient Stated Goals walk longer, play tennis without pain, reduce lumbar pain    Currently in Pain? No/denies                               [facility] Adult PT Treatment/Exercise - 08/08/20 0001       Exercises   Exercises Shoulder      Lumbar Exercises: Stretches   Lower Trunk Rotation 2 reps;20 seconds      Lumbar Exercises: Supine   Ab Set 10 reps    AB Set Limitations with ball squeeze      Lumbar Exercises: Sidelying   Clam Both;Left;10 reps    Other Sidelying Lumbar Exercises open book x10 bil      Shoulder Exercises: Supine   Horizontal ABduction Strengthening;Both;20 reps;Theraband    Theraband Level (Shoulder Horizontal ABduction) Level 2 (Red)    Horizontal ABduction Limitations with ab bracing    External Rotation  Strengthening;Both;20 reps;Theraband    Theraband Level (Shoulder External Rotation) Level 2 (Red)    External Rotation Limitations with ab bracing    Diagonals Strengthening;Both;20 reps;Theraband    Theraband Level (Shoulder Diagonals) Level 2 (Red)    Diagonals Limitations with ab bracing      Manual Therapy   Manual Therapy Soft tissue mobilization;Myofascial release    Soft tissue mobilization elongation and release to bil lumbar spine and gluteals- using Addaday                    PT Education - 08/08/20 0904     Education Details Access Code: [email or web address]    Person(s) Educated Patient    Methods Explanation;Demonstration;Handout    Comprehension Verbalized understanding;Returned demonstration              PT Short Term Goals - 08/08/20 0905       PT SHORT TERM GOAL #1   Title be independent in initial HEP    Status Achieved      PT SHORT TERM GOAL #2   Title tolerate walking for exercise x 15 minutes without limitation due  to LBP    Status On-going      PT SHORT TERM GOAL #3   Title report a 30% reduction in LBP with cooking/baking and yardwork               PT Long Term Goals - 07/24/20 0801       PT LONG TERM GOAL #1   Title be independent in advanced HEP    Time 8    Period Weeks    Status New    Target Date 09/18/20      PT LONG TERM GOAL #2   Title increase FOTO to > or = to 68 to improve function    Time 8    Period Weeks    Status New    Target Date 09/18/20      PT LONG TERM GOAL #3   Title walk for 30 minutes without limitation due to LBP    Time 8    Period Weeks    Status New    Target Date 09/18/20      PT LONG TERM GOAL #4   Title stand with neutral standing posture (avoiding lunbar extension) > or = to 75% of the time to improve functional core and hip strength    Time 8    Period Weeks    Status New    Target Date 09/18/20      PT LONG TERM GOAL #5   Title report a 70% redution in LBP with cooking/baking and  yardwork    Time 8    Period Weeks    Status New    Target Date 09/18/20      Additional Long Term Goals   Additional Long Term Goals Yes      PT LONG TERM GOAL #6   Title improve functional LE strength to get up/down from the floor with 50% increased ease    Time 8    Period Weeks    Status New    Target Date 09/18/20                   Plan - 08/08/20 0910     Clinical Impression Statement Pt reports that her back feels "tired" vs painful over the past couple of days.  PT added postural strength exercises with ab bracing to HEP to improve postural alignment and strength.  Pt required minor tactile cues for core activation and relaxation of upper traps.  Pt has not tried to walk for exercise or play tennis this week due to heat outside. Pt will continue to benefit from skilled PT to address lumbar pain, tissue mobility and lumbopelvic strength.    PT Frequency 2x / week    PT Duration 8 weeks    PT Treatment/Interventions ADLs/Self Care Home Management;Cryotherapy;Moist Heat;Electrical Stimulation;Gait training;Functional mobility training;Neuromuscular re-education;Therapeutic exercise;Therapeutic activities;Patient/family education;Manual techniques;Taping;Dry needling;Spinal Manipulations;Joint Manipulations    PT Next Visit Plan postural strength, lumbopelvic strength, review HEP, DN    PT Home Exercise Plan Access Code: [email or web address]    Consulted and Agree with Plan of Care Patient             Patient will benefit from skilled therapeutic intervention in order to improve the following deficits and impairments:  Decreased activity tolerance, Pain, Postural dysfunction, Impaired flexibility, Decreased strength, Decreased range of motion, Improper body mechanics, Increased muscle spasms, Decreased endurance  Visit Diagnosis: Chronic bilateral low back pain without sciatica  Cramp and spasm  Abnormal posture     Problem  List Patient Active Problem List   Diagnosis  Date Noted   Abnormality of lung on chest x-ray 08/25/2018   History of bronchitis 08/25/2018   Medicare annual wellness visit, initial 03/11/2016   Routine general medical examination at a health care facility 03/11/2016   Acute bronchitis 01/29/2013   URI 12/12/2009   HYPERLIPIDEMIA 12/06/2008    Lorrene Reid, PT 08/08/20 9:36 AM   [facility] [address], [address] [place], [place], [address] Phone: [phone]   Fax:  [phone]  Name: Michelle Cook MRN: [id number] Date of Birth: 04/13/1950

## 2020-08-08 NOTE — Patient Instructions (Signed)
Access Code: [email or web address] URL: [email or web address] Date: 08/08/2020 Prepared by: Tresa Endo  Exercises  Supine Shoulder Horizontal Abduction with Resistance - 1 x daily - 7 x weekly - 2 sets - 10 reps Supine Bilateral Shoulder External Rotation with Resistance - 1 x daily - 7 x weekly - 2 sets - 10 reps Supine PNF D2 Flexion with Resistance - 1 x daily - 7 x weekly - 2 sets - 10 reps

## 2020-08-13 ENCOUNTER — Ambulatory Visit: Payer: Medicare Other

## 2020-08-13 ENCOUNTER — Other Ambulatory Visit: Payer: Self-pay

## 2020-08-13 DIAGNOSIS — G8929 Other chronic pain: Secondary | ICD-10-CM | POA: Diagnosis not present

## 2020-08-13 DIAGNOSIS — M545 Low back pain, unspecified: Secondary | ICD-10-CM

## 2020-08-13 DIAGNOSIS — R252 Cramp and spasm: Secondary | ICD-10-CM | POA: Diagnosis not present

## 2020-08-13 DIAGNOSIS — R293 Abnormal posture: Secondary | ICD-10-CM | POA: Diagnosis not present

## 2020-08-13 NOTE — Therapy (Signed)
[facility] [address], [address] [place], [place], [address] Phone: [phone]   Fax:  [phone]  Physical Therapy Treatment  Patient Details  Name: Michelle Cook MRN: [id number] Date of Birth: 08-23-1950 Referring Provider (PT): Ria Clock, MD   Encounter Date: 08/13/2020   PT End of Session - 08/13/20 0926     Visit Number 5    Date for PT Re-Evaluation 09/18/20    Authorization Type Medicare    Progress Note Due on Visit 10    PT Start Time 0848    PT Stop Time 0929    PT Time Calculation (min) 41 min    Activity Tolerance Patient tolerated treatment well    Behavior During Therapy [facility] for tasks assessed/performed             Past Medical History:  Diagnosis Date   Chicken pox    GERD (gastroesophageal reflux disease)    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   Subjective Assessment - 08/13/20 0852     Subjective I'm doing ok with my new exercises.    Patient Stated Goals walk longer, play tennis without pain, reduce lumbar pain    Currently in Pain? No/denies                               [facility] Adult PT Treatment/Exercise - 08/13/20 0001       Lumbar Exercises: Stretches   Lower Trunk Rotation 2 reps;20 seconds      Lumbar Exercises: Supine   Ab Set 10 reps    AB Set Limitations with ball squeeze      Shoulder Exercises: Supine   Horizontal ABduction Strengthening;Both;20 reps;Theraband    Theraband Level (Shoulder Horizontal ABduction) Level 2 (Red)    Horizontal ABduction Limitations with ab bracing    External Rotation Strengthening;Both;20 reps;Theraband    Theraband Level (Shoulder External Rotation) Level 2 (Red)    External Rotation Limitations with ab bracing     Diagonals Strengthening;Both;20 reps;Theraband    Theraband Level (Shoulder Diagonals) Level 2 (Red)    Diagonals Limitations with ab bracing      Manual Therapy   Manual Therapy Soft tissue mobilization;Myofascial release    Manual therapy comments skilled palpation and assessment performed during dry needling    Soft tissue mobilization elongation and release to bil lumbar spine and gluteals              Trigger Point Dry Needling - 08/13/20 0001     Consent Given? Yes    Education Handout Provided Previously provided    Muscles Treated Back/Hip Lumbar multifidi;Gluteus maximus;Gluteus medius;Gluteus minimus    Gluteus Minimus Response Twitch response elicited;Palpable increased muscle length    Gluteus Medius Response Twitch response elicited;Palpable increased muscle length    Gluteus Maximus Response Twitch response elicited;Palpable increased muscle length    Lumbar multifidi Response Twitch response elicited;Palpable increased muscle length                    PT Short Term Goals - 08/08/20 0905       PT SHORT TERM GOAL #1   Title be independent in initial HEP  Status Achieved      PT SHORT TERM GOAL #2   Title tolerate walking for exercise x 15 minutes without limitation due to LBP    Status On-going      PT SHORT TERM GOAL #3   Title report a 30% reduction in LBP with cooking/baking and yardwork               PT Long Term Goals - 07/24/20 0801       PT LONG TERM GOAL #1   Title be independent in advanced HEP    Time 8    Period Weeks    Status New    Target Date 09/18/20      PT LONG TERM GOAL #2   Title increase FOTO to > or = to 68 to improve function    Time 8    Period Weeks    Status New    Target Date 09/18/20      PT LONG TERM GOAL #3   Title walk for 30 minutes without limitation due to LBP    Time 8    Period Weeks    Status New    Target Date 09/18/20      PT LONG TERM GOAL #4   Title stand with neutral standing  posture (avoiding lunbar extension) > or = to 75% of the time to improve functional core and hip strength    Time 8    Period Weeks    Status New    Target Date 09/18/20      PT LONG TERM GOAL #5   Title report a 70% redution in LBP with cooking/baking and yardwork    Time 8    Period Weeks    Status New    Target Date 09/18/20      Additional Long Term Goals   Additional Long Term Goals Yes      PT LONG TERM GOAL #6   Title improve functional LE strength to get up/down from the floor with 50% increased ease    Time 8    Period Weeks    Status New    Target Date 09/18/20                   Plan - 08/13/20 0857     Clinical Impression Statement Pt reports overall reduction in stiffness since the start of care.  Pt is performing all HEP well today and required minor and fewer tactile cues for core activation and relaxation of upper traps.  Pt has not tried to walk for exercise or play tennis this week due to heat outside and recent tooth/mouth pain. Pt with tension and trigger points in bil lumbar paraspinals and gluteals with good twitch response and improved tissue mobility after dry needling and manual today. Pt will continue to benefit from skilled PT to address lumbar pain, tissue mobility and lumbopelvic strength.    PT Frequency 2x / week    PT Duration 8 weeks    PT Treatment/Interventions ADLs/Self Care Home Management;Cryotherapy;Moist Heat;Electrical Stimulation;Gait training;Functional mobility training;Neuromuscular re-education;Therapeutic exercise;Therapeutic activities;Patient/family education;Manual techniques;Taping;Dry needling;Spinal Manipulations;Joint Manipulations    PT Next Visit Plan postural strength, lumbopelvic strength, assess response to DN    PT Home Exercise Plan Access Code: [email or web address]    Consulted and Agree with Plan of Care Patient             Patient will benefit from skilled therapeutic intervention in order to improve the following  deficits and impairments:  Decreased activity tolerance, Pain, Postural dysfunction, Impaired flexibility, Decreased strength, Decreased range of motion, Improper body mechanics, Increased muscle spasms, Decreased endurance  Visit Diagnosis: Chronic bilateral low back pain without sciatica  Cramp and spasm  Abnormal posture     Problem List Patient Active Problem List   Diagnosis Date Noted   Abnormality of lung on chest x-ray 08/25/2018   History of bronchitis 08/25/2018   Medicare annual wellness visit, initial 03/11/2016   Routine general medical examination at a health care facility 03/11/2016   Acute bronchitis 01/29/2013   URI 12/12/2009   HYPERLIPIDEMIA 12/06/2008     Lorrene Reid, PT 08/13/20 9:32 AM   [facility] [address] [place], [place], [address] Phone: [phone]   Fax:  [phone]  Name: CIA GARRETSON MRN: [id number] Date of Birth: 05-Feb-1951

## 2020-08-14 DIAGNOSIS — H10413 Chronic giant papillary conjunctivitis, bilateral: Secondary | ICD-10-CM | POA: Diagnosis not present

## 2020-08-14 DIAGNOSIS — H35372 Puckering of macula, left eye: Secondary | ICD-10-CM | POA: Diagnosis not present

## 2020-08-14 DIAGNOSIS — H43812 Vitreous degeneration, left eye: Secondary | ICD-10-CM | POA: Diagnosis not present

## 2020-08-14 DIAGNOSIS — Z961 Presence of intraocular lens: Secondary | ICD-10-CM | POA: Diagnosis not present

## 2020-08-15 ENCOUNTER — Ambulatory Visit: Payer: Medicare Other

## 2020-08-15 ENCOUNTER — Other Ambulatory Visit: Payer: Self-pay

## 2020-08-15 DIAGNOSIS — M545 Low back pain, unspecified: Secondary | ICD-10-CM

## 2020-08-15 DIAGNOSIS — R293 Abnormal posture: Secondary | ICD-10-CM | POA: Diagnosis not present

## 2020-08-15 DIAGNOSIS — G8929 Other chronic pain: Secondary | ICD-10-CM | POA: Diagnosis not present

## 2020-08-15 DIAGNOSIS — R252 Cramp and spasm: Secondary | ICD-10-CM

## 2020-08-15 LAB — COLOGUARD: COLOGUARD: NEGATIVE

## 2020-08-15 NOTE — Therapy (Signed)
[facility] [address] [place], [place], [address] Phone: [phone]   Fax:  [phone]  Physical Therapy Treatment  Patient Details  Name: Michelle Cook MRN: [id number] Date of Birth: [birth year] Referring Provider (PT): Ria Clock, MD   Encounter Date: 08/15/2020   PT End of Session - 08/15/20 0926     Visit Number 6    Date for PT Re-Evaluation 09/18/20    Authorization Type Medicare    Progress Note Due on Visit 10    PT Start Time 0846    PT Stop Time 0924    PT Time Calculation (min) 38 min    Activity Tolerance Patient tolerated treatment well    Behavior During Therapy [facility] for tasks assessed/performed             Past Medical History:  Diagnosis Date   Chicken pox    GERD (gastroesophageal reflux disease)    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   Subjective Assessment - 08/15/20 0851     Subjective I'm still having pain from my tooth.  I had a root canal yesterday.  I'm doing my exercises and my back is feeling better.    Patient Stated Goals walk longer, play tennis without pain, reduce lumbar pain    Currently in Pain? No/denies                               [facility] Adult PT Treatment/Exercise - 08/15/20 0001       Lumbar Exercises: Standing   Row Strengthening;Both;20 reps;Theraband    Theraband Level (Row) Level 2 (Red)    Shoulder Extension Strengthening;Both;20 reps;Theraband    Theraband Level (Shoulder Extension) Level 2 (Red)    Shoulder Extension Limitations standing on foam pad.  Tactile correction of alignment- pt was able to stabilize after correction      Lumbar Exercises: Seated   Sit to Stand 20 reps    Sit to Stand Limitations 5# kettlebell       Knee/Hip Exercises: Standing   Hip Abduction Stengthening;2 sets;10 reps    Abduction Limitations standing on foam pad- tactile cues for glute med activation                      PT Short Term Goals - 08/08/20 0905       PT SHORT TERM GOAL #1   Title be independent in initial HEP    Status Achieved      PT SHORT TERM GOAL #2   Title tolerate walking for exercise x 15 minutes without limitation due to LBP    Status On-going      PT SHORT TERM GOAL #3   Title report a 30% reduction in LBP with cooking/baking and yardwork               PT Long Term Goals - 08/15/20 9935       PT LONG TERM GOAL #1   Title be independent in advanced HEP    Status On-going      PT LONG TERM GOAL #3   Title walk for 30 minutes without limitation due to LBP  Status On-going      PT LONG TERM GOAL #4   Title stand with neutral standing posture (avoiding lumbar extension) > or = to 75% of the time to improve functional core and hip strength    Status On-going      PT LONG TERM GOAL #5   Title report a 70% redution in LBP with cooking/baking and yardwork    Baseline 20% better    Status On-going                   Plan - 08/15/20 0630     Clinical Impression Statement Pt reports 20% overall reduction in stiffness since the start of care.   Pt has not tried to walk for exercise or play tennis this week due to heat outside and recent tooth/mouth pain. Pt did well with core stabilization exercises today and stands with improved posture without lumbar extension. Pt required tactile and verbal cues for technique and alignment.  Pt will continue to benefit from skilled PT to address lumbar pain, tissue mobility and lumbopelvic strength.    PT Frequency 2x / week    PT Duration 8 weeks    PT Treatment/Interventions ADLs/Self Care Home Management;Cryotherapy;Moist Heat;Electrical Stimulation;Gait training;Functional mobility training;Neuromuscular re-education;Therapeutic  exercise;Therapeutic activities;Patient/family education;Manual techniques;Taping;Dry needling;Spinal Manipulations;Joint Manipulations    PT Next Visit Plan postural strength, lumbopelvic strength, continue DN as helpful    PT Home Exercise Plan Access Code: [email or web address]    Consulted and Agree with Plan of Care Patient             Patient will benefit from skilled therapeutic intervention in order to improve the following deficits and impairments:  Decreased activity tolerance, Pain, Postural dysfunction, Impaired flexibility, Decreased strength, Decreased range of motion, Improper body mechanics, Increased muscle spasms, Decreased endurance  Visit Diagnosis: Cramp and spasm  Chronic bilateral low back pain without sciatica  Abnormal posture     Problem List Patient Active Problem List   Diagnosis Date Noted   Abnormality of lung on chest x-ray 08/25/2018   History of bronchitis 08/25/2018   Medicare annual wellness visit, initial 03/11/2016   Routine general medical examination at a health care facility 03/11/2016   Acute bronchitis 01/29/2013   URI 12/12/2009   HYPERLIPIDEMIA 12/06/2008   Lorrene Reid, PT 08/15/20 9:30 AM   [facility] [address], [address] [place], [place], [address] Phone: [phone]   Fax:  [phone]  Name: Michelle Cook MRN: [id number] Date of Birth: [birth year]

## 2020-08-16 LAB — COLOGUARD: Cologuard: NEGATIVE

## 2020-08-16 NOTE — Addendum Note (Signed)
Addended by: Lisbeth Renshaw, Ruston Fedora HUA on: 08/16/2020 08:43 AM   Modules accepted: Orders

## 2020-08-21 ENCOUNTER — Other Ambulatory Visit: Payer: Self-pay

## 2020-08-21 ENCOUNTER — Ambulatory Visit: Payer: Medicare Other

## 2020-08-21 DIAGNOSIS — G8929 Other chronic pain: Secondary | ICD-10-CM | POA: Diagnosis not present

## 2020-08-21 DIAGNOSIS — M545 Low back pain, unspecified: Secondary | ICD-10-CM | POA: Diagnosis not present

## 2020-08-21 DIAGNOSIS — R293 Abnormal posture: Secondary | ICD-10-CM

## 2020-08-21 DIAGNOSIS — R252 Cramp and spasm: Secondary | ICD-10-CM | POA: Diagnosis not present

## 2020-08-21 NOTE — Therapy (Signed)
[facility] [address] [place], [place], [address] Phone: [phone]   Fax:  [phone]  Physical Therapy Treatment  Patient Details  Name: Michelle Cook MRN: [id number] Date of Birth: 1950-11-29 Referring Provider (PT): Ria Clock, MD   Encounter Date: 08/21/2020   PT End of Session - 08/21/20 0927     Visit Number 7    Date for PT Re-Evaluation 09/18/20    Authorization Type Medicare    Progress Note Due on Visit 10    PT Start Time 0847    PT Stop Time 0928    PT Time Calculation (min) 41 min    Activity Tolerance Patient tolerated treatment well    Behavior During Therapy [facility] for tasks assessed/performed             Past Medical History:  Diagnosis Date   Chicken pox    GERD (gastroesophageal reflux disease)    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   Subjective Assessment - 08/21/20 0847     Subjective My back feels tired sometimes.    Currently in Pain? No/denies    Pain Score 0-No pain   no pain, just fatigue   Pain Location Back    Pain Descriptors / Indicators Aching;Tightness    Pain Type Chronic pain    Pain Onset More than a month ago    Pain Frequency Intermittent    Aggravating Factors  standing, walking, yardwork    Pain Relieving Factors sitting down                               [facility] Adult PT Treatment/Exercise - 08/21/20 0001       Lumbar Exercises: Standing   Row Strengthening;Both;20 reps;Theraband    Theraband Level (Row) Level 2 (Red)    Row Limitations standing on foam pad    Shoulder Extension Strengthening;Both;20 reps;Theraband    Theraband Level (Shoulder Extension) Level 2 (Red)    Shoulder Extension Limitations standing on foam pad       Lumbar Exercises: Seated   Sit to Stand 20 reps    Sit to Stand Limitations 5# kettlebell      Manual Therapy   Manual Therapy Soft tissue mobilization;Myofascial release    Manual therapy comments skilled palpation and assessment performed during dry needling    Soft tissue mobilization elongation and release to bil lumbar spine and gluteals              Trigger Point Dry Needling - 08/21/20 0001     Consent Given? Yes    Muscles Treated Back/Hip Lumbar multifidi;Gluteus maximus;Gluteus medius;Gluteus minimus    Gluteus Minimus Response Twitch response elicited;Palpable increased muscle length    Gluteus Medius Response Twitch response elicited;Palpable increased muscle length    Gluteus Maximus Response Twitch response elicited;Palpable increased muscle length    Lumbar multifidi Response Twitch response elicited;Palpable increased muscle length                    PT Short Term Goals - 08/08/20 0905       PT SHORT TERM GOAL #1   Title be independent in initial HEP  Status Achieved      PT SHORT TERM GOAL #2   Title tolerate walking for exercise x 15 minutes without limitation due to LBP    Status On-going      PT SHORT TERM GOAL #3   Title report a 30% reduction in LBP with cooking/baking and yardwork               PT Long Term Goals - 08/21/20 0850       PT LONG TERM GOAL #1   Title be independent in advanced HEP    Status On-going      PT LONG TERM GOAL #3   Title walk for 30 minutes without limitation due to LBP    Status On-going      PT LONG TERM GOAL #4   Title stand with neutral standing posture (avoiding lumbar extension) > or = to 75% of the time to improve functional core and hip strength    Baseline 50% in the clinic      PT LONG TERM GOAL #5   Title report a 70% redution in LBP with cooking/baking and yardwork    Baseline 20%-not super active due to mouth pain      PT LONG TERM GOAL #6   Title improve functional LE  strength to get up/down from the floor with 50% increased ease    Status On-going                   Plan - 08/21/20 0857     Clinical Impression Statement Pt reports 20% overall reduction in stiffness since the start of care.  Pt with main complaint of lumbar fatigue with increased activity.  Pt has not tried to walk for exercise or play tennis this week due to heat outside and recent tooth/mouth pain. Pt did well with core stabilization exercises today and stands with improved posture without lumbar extension ~50% of the time without cueing by PT. Pt required tactile and verbal cues for technique and alignment.  Pt will continue to benefit from skilled PT to address lumbar pain, tissue mobility and lumbopelvic strength.    PT Frequency 2x / week    PT Duration 8 weeks    PT Treatment/Interventions ADLs/Self Care Home Management;Cryotherapy;Moist Heat;Electrical Stimulation;Gait training;Functional mobility training;Neuromuscular re-education;Therapeutic exercise;Therapeutic activities;Patient/family education;Manual techniques;Taping;Dry needling;Spinal Manipulations;Joint Manipulations    PT Next Visit Plan postural strength, lumbopelvic strength, continue DN as helpful    PT Home Exercise Plan Access Code: [email or web address]    Consulted and Agree with Plan of Care Patient             Patient will benefit from skilled therapeutic intervention in order to improve the following deficits and impairments:  Decreased activity tolerance, Pain, Postural dysfunction, Impaired flexibility, Decreased strength, Decreased range of motion, Improper body mechanics, Increased muscle spasms, Decreased endurance  Visit Diagnosis: Cramp and spasm  Chronic bilateral low back pain without sciatica  Abnormal posture     Problem List Patient Active Problem List   Diagnosis Date Noted   Abnormality of lung on chest x-ray 08/25/2018   History of bronchitis 08/25/2018   Medicare annual wellness visit,  initial 03/11/2016   Routine general medical examination at a health care facility 03/11/2016   Acute bronchitis 01/29/2013   URI 12/12/2009   HYPERLIPIDEMIA 12/06/2008    Lorrene Reid, PT 08/21/20 9:29 AM   [facility] [address], [address] [place], [place], [address] Phone: [phone]   Fax:  [phone]  Name: Michelle Cook MRN: [id number] Date of Birth: 08-21-1950

## 2020-08-23 ENCOUNTER — Other Ambulatory Visit: Payer: Self-pay

## 2020-08-23 ENCOUNTER — Ambulatory Visit: Payer: Medicare Other

## 2020-08-23 DIAGNOSIS — R252 Cramp and spasm: Secondary | ICD-10-CM | POA: Diagnosis not present

## 2020-08-23 DIAGNOSIS — G8929 Other chronic pain: Secondary | ICD-10-CM

## 2020-08-23 DIAGNOSIS — M545 Low back pain, unspecified: Secondary | ICD-10-CM | POA: Diagnosis not present

## 2020-08-23 DIAGNOSIS — R293 Abnormal posture: Secondary | ICD-10-CM | POA: Diagnosis not present

## 2020-08-23 NOTE — Therapy (Signed)
[facility] [address] [place], [place], [address] Phone: [phone]   Fax:  [phone]  Physical Therapy Treatment  Patient Details  Name: Michelle Cook MRN: [id number] Date of Birth: [birth year] Referring Provider (PT): Ria Clock, MD   Encounter Date: 08/23/2020   PT End of Session - 08/23/20 0922     Visit Number 8    Date for PT Re-Evaluation 09/18/20    Authorization Type Medicare    Progress Note Due on Visit 10    PT Start Time 0847    PT Stop Time 0922    PT Time Calculation (min) 35 min    Activity Tolerance Patient tolerated treatment well    Behavior During Therapy [facility] for tasks assessed/performed             Past Medical History:  Diagnosis Date   Chicken pox    GERD (gastroesophageal reflux disease)    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   Subjective Assessment - 08/23/20 0853     Subjective I haven't tried walking long distances.  I did walk with a friend for a little longer and I didn't have any problems.    Currently in Pain? No/denies                               [facility] Adult PT Treatment/Exercise - 08/23/20 0001       Lumbar Exercises: Stretches   Active Hamstring Stretch Left;Right;2 reps;20 seconds      Lumbar Exercises: Standing   Row Strengthening;Both;20 reps;Theraband    Theraband Level (Row) Level 2 (Red)    Shoulder Extension Strengthening;Both;20 reps;Theraband    Theraband Level (Shoulder Extension) Level 2 (Red)      Lumbar Exercises: Seated   Other Seated Lumbar Exercises Red theraband: horizontal abduction, D2 and ER 2x10 bil each      Knee/Hip Exercises: Aerobic   Nustep Level 2x 8 min- PT present to discuss progress                     PT Education - 08/23/20 0916     Education Details Access Code: [email or web address]    Person(s) Educated Patient    Methods Explanation;Demonstration;Handout    Comprehension Verbalized understanding;Returned demonstration              PT Short Term Goals - 08/08/20 0905       PT SHORT TERM GOAL #1   Title be independent in initial HEP    Status Achieved      PT SHORT TERM GOAL #2   Title tolerate walking for exercise x 15 minutes without limitation due to LBP    Status On-going      PT SHORT TERM GOAL #3   Title report a 30% reduction in LBP with cooking/baking and yardwork               PT Long Term Goals - 08/21/20 0850       PT LONG TERM GOAL #1   Title be independent in advanced HEP    Status On-going      PT LONG TERM GOAL #3   Title walk  for 30 minutes without limitation due to LBP    Status On-going      PT LONG TERM GOAL #4   Title stand with neutral standing posture (avoiding lumbar extension) > or = to 75% of the time to improve functional core and hip strength    Baseline 50% in the clinic      PT LONG TERM GOAL #5   Title report a 70% redution in LBP with cooking/baking and yardwork    Baseline 20%-not super active due to mouth pain      PT LONG TERM GOAL #6   Title improve functional LE strength to get up/down from the floor with 50% increased ease    Status On-going                   Plan - 08/23/20 2458     Clinical Impression Statement Pt reports 20% overall reduction in stiffness since the start of care.  Pt with main complaint of lumbar fatigue with increased activity.  Pt has not tried to this week due to heat outside and recent tooth/mouth pain. Pt did walk a little longer the other day with a friend and did well with this.   Pt did well with core stabilization exercises today and stands with improved posture without lumbar extension ~50% of the time without cueing by PT. Pt advanced to theraband unattatached in  sitting with core activation and reuqired minor cues for alignment. PT encouraged pt to try to maintain this neutral trunk alignment with walking to build strength in standing.  Pt required tactile and verbal cues for technique and alignment.  Pt will take next week off and will return to PT in 1 week or so to reassess symptoms. Pt will continue to benefit from skilled PT to address lumbar pain, tissue mobility and lumbopelvic strength.    PT Frequency 2x / week    PT Duration 8 weeks    PT Treatment/Interventions ADLs/Self Care Home Management;Cryotherapy;Moist Heat;Electrical Stimulation;Gait training;Functional mobility training;Neuromuscular re-education;Therapeutic exercise;Therapeutic activities;Patient/family education;Manual techniques;Taping;Dry needling;Spinal Manipulations;Joint Manipulations    PT Next Visit Plan Pt will return in 1.5 weeks to reassess symptoms and goals.  DN to lumbar/gluteals.    PT Home Exercise Plan Access Code: [email or web address]    Consulted and Agree with Plan of Care Patient             Patient will benefit from skilled therapeutic intervention in order to improve the following deficits and impairments:  Decreased activity tolerance, Pain, Postural dysfunction, Impaired flexibility, Decreased strength, Decreased range of motion, Improper body mechanics, Increased muscle spasms, Decreased endurance  Visit Diagnosis: Cramp and spasm  Chronic bilateral low back pain without sciatica  Abnormal posture     Problem List Patient Active Problem List   Diagnosis Date Noted   Abnormality of lung on chest x-ray 08/25/2018   History of bronchitis 08/25/2018   Medicare annual wellness visit, initial 03/11/2016   Routine general medical examination at a health care facility 03/11/2016   Acute bronchitis 01/29/2013   URI 12/12/2009   HYPERLIPIDEMIA 12/06/2008    Lorrene Reid, PT 08/23/20 9:26 AM  [facility] [address] [place], [place], [address] Phone: [phone]   Fax:  [phone]  Name: Michelle Cook MRN: [id number] Date of Birth: 10-22-1950

## 2020-08-23 NOTE — Patient Instructions (Signed)
Access Code: [email or web address] URL: [email or web address] Date: 08/23/2020 Prepared by: Tresa Endo   Scapular Retraction with Resistance - 2 x daily - 7 x weekly - 10 reps - 2 sets Shoulder extension with resistance - Neutral - 2 x daily - 7 x weekly - 10 reps - 2 sets

## 2020-09-06 ENCOUNTER — Other Ambulatory Visit: Payer: Self-pay

## 2020-09-06 ENCOUNTER — Ambulatory Visit: Payer: Medicare Other | Attending: Family

## 2020-09-06 DIAGNOSIS — M545 Low back pain, unspecified: Secondary | ICD-10-CM | POA: Diagnosis not present

## 2020-09-06 DIAGNOSIS — R293 Abnormal posture: Secondary | ICD-10-CM | POA: Diagnosis not present

## 2020-09-06 DIAGNOSIS — R252 Cramp and spasm: Secondary | ICD-10-CM

## 2020-09-06 DIAGNOSIS — G8929 Other chronic pain: Secondary | ICD-10-CM | POA: Insufficient documentation

## 2020-09-06 NOTE — Therapy (Signed)
[facility] [address], [place], [place], [address] Phone: [phone]   Fax:  [phone]  Physical Therapy Treatment  Patient Details  Name: Michelle Cook MRN: [id number] Date of Birth: 07/09/1950 Referring Provider (PT): Jodi Mourning, MD   Encounter Date: 09/06/2020   PT End of Session - 09/06/20 1009     Visit Number 9    PT Start Time 0931    PT Stop Time 1010    PT Time Calculation (min) 39 min    Activity Tolerance Patient tolerated treatment well    Behavior During Therapy [facility] for tasks assessed/performed             Past Medical History:  Diagnosis Date   Chicken pox    GERD (gastroesophageal reflux disease)    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   Subjective Assessment - 09/06/20 0934     Subjective I walked 15 minutes in my neighborhood and my back didn't feel as bad.    Patient Stated Goals walk longer, play tennis without pain, reduce lumbar pain    Currently in Pain? No/denies                [facility] PT Assessment - 09/06/20 0001       Assessment   Medical Diagnosis chronic LBP without sciatica, scoliosis    Referring Provider (PT) Jodi Mourning, MD      Observation/Other Assessments   Focus on Therapeutic Outcomes (FOTO)  58      Posture/Postural Control   Posture/Postural Control Postural limitations    Postural Limitations Forward head;Rounded Shoulders;Posterior pelvic tilt    Posture Comments trunk extension in standing, Rt lumbar convexity, Lt thoracic convexity scoliosis      Palpation   Palpation comment tension over Rt thoraco lumbar musculature.  Tension in bil gluteals- improved from initial evaluation                           [facility] Adult PT  Treatment/Exercise - 09/06/20 0001       Lumbar Exercises: Standing   Row Strengthening;Both;20 reps;Theraband    Theraband Level (Row) Level 2 (Red)    Shoulder Extension Strengthening;Both;20 reps;Theraband    Theraband Level (Shoulder Extension) Level 2 (Red)      Lumbar Exercises: Seated   Sit to Stand 20 reps    Sit to Stand Limitations 5# kettlebell    Other Seated Lumbar Exercises Red theraband: horizontal abduction, D2 and ER 2x10 bil each   against the wall     Knee/Hip Exercises: Aerobic   Nustep Level 2x 10 min- PT present to discuss progress                      PT Short Term Goals - 08/08/20 0905       PT SHORT TERM GOAL #1   Title be independent in initial HEP    Status Achieved      PT SHORT TERM GOAL #2   Title tolerate walking for exercise x 15 minutes without limitation due to LBP    Status On-going      PT SHORT TERM GOAL #3   Title report a  30% reduction in LBP with cooking/baking and yardwork               PT Long Term Goals - 09/06/20 0936       PT LONG TERM GOAL #3   Title walk for 30 minutes without limitation due to LBP    Baseline 15 minutes    Status Partially Met      PT LONG TERM GOAL #4   Title stand with neutral standing posture (avoiding lumbar extension) > or = to 75% of the time to improve functional core and hip strength    Baseline 50-60% in the clinic    Status Partially Met      PT LONG TERM GOAL #5   Title report a 70% redution in LBP with cooking/baking and yardwork    Baseline 50-60%    Status Partially Met      PT LONG TERM GOAL #6   Title improve functional LE strength to get up/down from the floor with 50% increased ease    Status On-going                   Plan - 09/06/20 1017     Clinical Impression Statement Pt reports 40-50% overall improvement in symptoms since the start of care.  Pt is now able to walk 15 minutes without increased lumbar pain.  Pt with increased functional strength  and is able to get on and off the floor with increased ease.  Pt with improved alignment in standing and continues to require tactile and verbal cues to reduce lumbar extension.  Pt performed standing theraband exercises against the wall to improve alignment.  Verbal cues for scapular depression and core engagement.  Pt will D/C today to HEP and will follow-up with MD as needed.    PT Next Visit Plan D/C PT to HEP    PT Home Exercise Plan Access Code: [email or web address]    Consulted and Agree with Plan of Care Patient             Patient will benefit from skilled therapeutic intervention in order to improve the following deficits and impairments:     Visit Diagnosis: Cramp and spasm  Chronic bilateral low back pain without sciatica  Abnormal posture     Problem List Patient Active Problem List   Diagnosis Date Noted   Abnormality of lung on chest x-ray 08/25/2018   History of bronchitis 08/25/2018   Medicare annual wellness visit, initial 03/11/2016   Routine general medical examination at a health care facility 03/11/2016   Acute bronchitis 01/29/2013   URI 12/12/2009   HYPERLIPIDEMIA 12/06/2008   PHYSICAL THERAPY DISCHARGE SUMMARY  Visits from Start of Care: 9  Current functional level related to goals / functional outcomes: See above for current status.  Pt will D/C to HEP today and follow-up with MD as needed.     Remaining deficits: Core weakness and lumbar extension in standing which pt is able to correct with tactile and verbal cues.  Pt is limited in walking for exercise and is working on advancing this.     Education / Equipment: Holiday representative    Patient agrees to discharge. Patient goals were partially met. Patient is being discharged due to being pleased with the current functional level.  Sigurd Sos, PT 09/06/20 10:18 AM   [facility] [address] [place], [place], [address] Phone:  [phone]   Fax:  [phone]  Name: Michelle  DAJAHNAE Cook MRN: [id number] Date of Birth: 09/20/1950

## 2020-10-10 ENCOUNTER — Ambulatory Visit
Admission: RE | Admit: 2020-10-10 | Discharge: 2020-10-10 | Disposition: A | Discharge status: Home / Self Care | Payer: Medicare Other | Source: Ambulatory Visit | Attending: Family | Admitting: Family

## 2020-10-10 ENCOUNTER — Other Ambulatory Visit: Payer: Self-pay

## 2020-10-10 DIAGNOSIS — Z1231 Encounter for screening mammogram for malignant neoplasm of breast: Secondary | ICD-10-CM

## 2020-12-19 DIAGNOSIS — Z23 Encounter for immunization: Secondary | ICD-10-CM | POA: Diagnosis not present

## 2021-01-22 NOTE — Progress Notes (Signed)
[facility] - Clinic Note  01/25/2021     CHIEF COMPLAINT Patient presents for Retina Follow Up   HISTORY OF PRESENT ILLNESS: Michelle Cook is a 70 y.o. female who presents to the clinic today for:   HPI     Retina Follow Up   Patient presents with  Other.  In left eye.  Duration of 12 months.  Since onset it is stable.  I, the attending physician,  performed the HPI with the patient and updated documentation appropriately.        Comments   1 year follow up ERM OS-  Vision stable, no new problems.        Last edited by Rennis Chris, MD on 01/25/2021 12:21 PM.      Referring physician: Olivia Canter, MD [address] [place],  [place] [address]  HISTORICAL INFORMATION:   Selected notes from the MEDICAL RECORD NUMBER Referred by Dr. Dione Booze for concern of ERM w/ edema OS;  LEE- 05.14.19 (S. [facility]at) [BCVA OD: 20/25 OS: 20/25] Ocular Hx- cataract OU, hyperopia, presbyopia, ERM w/ edema OS, allergic conjunctivitis OU PMH-     CURRENT MEDICATIONS: No current outpatient medications on file. (Ophthalmic Drugs)   No current facility-administered medications for this visit. (Ophthalmic Drugs)   Current Outpatient Medications (Other)  Medication Sig   Ascorbic Acid (VITAMIN C) 1000 MG tablet Take 1,000 mg by mouth daily.   Cholecalciferol (VITAMIN D PO) Take by mouth.   famotidine (PEPCID) 10 MG tablet Take 10 mg by mouth 2 (two) times daily.   Probiotic Product (PROBIOTIC DAILY) CAPS Take 1 each by mouth daily.   No current facility-administered medications for this visit. (Other)   REVIEW OF SYSTEMS: ROS   Positive for: Gastrointestinal, Eyes Negative for: Constitutional, Neurological, Skin, Genitourinary, Musculoskeletal, HENT, Endocrine, Cardiovascular, Respiratory, Psychiatric, Allergic/Imm, Heme/Lymph Last edited by Joni Reining, COA on 01/25/2021 10:02 AM.     ALLERGIES Allergies  Allergen Reactions    Sulfamethoxazole-Trimethoprim    Sulfa Antibiotics Rash    PAST MEDICAL HISTORY Past Medical History:  Diagnosis Date   Chicken pox    GERD (gastroesophageal reflux disease)    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   STD (sexually transmitted disease)    Herpes   Past Surgical History:  Procedure Laterality Date   TUBAL LIGATION      FAMILY HISTORY Family History  Problem Relation Age of Onset   COPD Mother    Heart failure Mother    Hypertension Mother    Hypertension Sister    Hypertension Brother     SOCIAL HISTORY Social History   Tobacco Use   Smoking status: Never   Smokeless tobacco: Never  Vaping Use   Vaping Use: Never used  Substance Use Topics   Alcohol use: Yes    Alcohol/week: 0.0 standard drinks    Comment: occasionally   Drug use: No       OPHTHALMIC EXAM:  Base Eye Exam     Visual Acuity (Snellen - Linear)       Right Left   Dist cc 20/20- 20/30+   Dist ph cc  NI    Correction: Glasses         Tonometry (Tonopen, 10:11 AM)       Right Left   Pressure 13 10         Pupils       Dark Light Shape React  APD   Right 4 3 Round Brisk None   Left 4 3 Round Brisk None         Visual Fields (Counting fingers)       Left Right    Full Full         Extraocular Movement       Right Left    Full Full         Neuro/Psych     Oriented x3: Yes   Mood/Affect: Normal         Dilation     Both eyes: 1.0% Mydriacyl, 2.5% Phenylephrine @ 10:12 AM           Slit Lamp and Fundus Exam     Slit Lamp Exam       Right Left   Lids/Lashes Dermatochalasis - upper lid Dermatochalasis - upper lid   Conjunctiva/Sclera White and quiet White and quiet   Cornea Arcus, well healed temporal cataract wounds, 1-2+ Punctate epithelial erosions Arcus, trace Punctate epithelial erosions, Debris in tear film   Anterior Chamber Deep and quiet Deep and quiet   Iris Round and well dilated Round and well  dilated   Lens Toric PC IOL in good position with marks at 0330 and 0930, trace PC folds PC IOL in good position   Anterior Vitreous Vitreous syneresis, Posterior vitreous detachment Vitreous syneresis, Posterior vitreous detachment, scattered vitreous condensations         Fundus Exam       Right Left   Disc Pink and Sharp Pink and Sharp, focal PPP temporal   C/D Ratio 0.3 0.2   Macula Flat, blunted foveal reflex, mild Retinal pigment epithelial mottling, No heme or edema Flat, blunted foveal reflex, Central Epiretinal membrane with striae/pucker, lamellar hole / pseudo hole with surrounding cystic change / foveoschisis -- stable from prior   Vessels attenuated, mild tortuousity, mild AV crossing changes, mild Copper wiring attenuated, mild tortuousity, mild AV crossing changes, mild Copper wiring   Periphery Attached Attached           Refraction     Wearing Rx       Sphere Cylinder Axis Add   Right -0.75 +1.00 157 +2.50   Left -1.00 +2.25 010 +2.50            IMAGING AND PROCEDURES  Imaging and Procedures for @TODAY @  OCT, Retina - OU - Both Eyes       Right Eye Quality was good. Central Foveal Thickness: 232. Progression has been stable. Findings include normal foveal contour, no IRF, no SRF.   Left Eye Quality was good. Central Foveal Thickness: 520. Progression has been stable. Findings include abnormal foveal contour, epiretinal membrane, no SRF, macular pucker (?mild improvement in cystic spaces).   Notes *Images captured and stored on drive  Diagnosis / Impression:  OD: NFP; no IRF/SRF OS: ERM w/ foveoschisis and pucker -- stable, ?mild improvement in cystic spaces  Clinical management:  See below  Abbreviations: NFP - Normal foveal profile. CME - cystoid macular edema. PED - pigment epithelial detachment. IRF - intraretinal fluid. SRF - subretinal fluid. EZ - ellipsoid zone. ERM - epiretinal membrane. ORA - outer retinal atrophy. ORT - outer retinal  tubulation. SRHM - subretinal hyper-reflective material            ASSESSMENT/PLAN:    ICD-10-CM   1. 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 Retinal edema  H35.81 OCT, Retina - OU - Both Eyes    4. Posterior vitreous detachment of left eye  H43.812     5. Pseudophakia, both eyes  Z96.1       1-3. Epiretinal membrane w/ pucker, pseudohole and foveoschisis OS-  - stable from prior -- no new symptoms -- remains asymptomatic; no metamorphopsia  - stable foveoschisis OS -- no CME/leakage on FA (5.30.2019)  - BCVA 20/30 -- stable  - no retinal or ophthalmic interventions indicated or recommended   - pt is cleared from a retina standpoint for release to Dr. Dione Booze and resumption of primary eye care  - can f/u here prn  4. PVD / vitreous syneresis OS  - Discussed findings and prognosis  - No RT or RD on 360 peripheral exam  - Reviewed s/s of RT/RD  - Strict return precautions for any such RT/RD signs/symptoms   5. Pseudophakia OU  - s/p CE/IOL OU (Dr. Zetta Bills, Nov 2021)  - IOLs in good position (OD toric)   - doing well  - monitor   Ophthalmic Meds Ordered this visit:  No orders of the defined types were placed in this encounter.    Return if symptoms worsen or fail to improve.  There are no Patient Instructions on file for this visit.   Explained the diagnoses, plan, and follow up with the patient and they expressed understanding.  Patient expressed understanding of the importance of proper follow up care.   This document serves as a record of services personally performed by Karie Chimera, MD, PhD. It was created on their behalf by Joni Reining, an ophthalmic technician. The creation of this record is the provider's dictation and/or activities during the visit.    Electronically signed by: Joni Reining COA, 01/25/21  12:23 PM  Karie Chimera, M.D., Ph.D. Diseases & Surgery of the Retina and Vitreous [facility] 01/25/2021  I have reviewed the above documentation for accuracy and completeness, and I agree with the above. Karie Chimera, M.D., Ph.D. 01/25/21 12:23 PM   Abbreviations: M myopia (nearsighted); A astigmatism; H hyperopia (farsighted); P presbyopia; Mrx spectacle prescription;  CTL contact lenses; OD right eye; OS left eye; OU both eyes  XT exotropia; ET esotropia; PEK punctate epithelial keratitis; PEE punctate epithelial erosions; DES dry eye syndrome; MGD meibomian gland dysfunction; ATs artificial tears; PFAT's preservative free artificial tears; NSC nuclear sclerotic cataract; PSC posterior subcapsular cataract; ERM epi-retinal membrane; PVD posterior vitreous detachment; RD retinal detachment; DM diabetes mellitus; DR diabetic retinopathy; NPDR non-proliferative diabetic retinopathy; PDR proliferative diabetic retinopathy; CSME clinically significant macular edema; DME diabetic macular edema; dbh dot blot hemorrhages; CWS cotton wool spot; POAG primary open angle glaucoma; C/D cup-to-disc ratio; HVF humphrey visual field; GVF goldmann visual field; OCT optical coherence tomography; IOP intraocular pressure; BRVO Branch retinal vein occlusion; CRVO central retinal vein occlusion; CRAO central retinal artery occlusion; BRAO branch retinal artery occlusion; RT retinal tear; SB scleral buckle; PPV pars plana vitrectomy; VH Vitreous hemorrhage; PRP panretinal laser photocoagulation; IVK intravitreal kenalog; VMT vitreomacular traction; MH Macular hole;  NVD neovascularization of the disc; NVE neovascularization elsewhere; AREDS age related eye disease study; ARMD age related macular degeneration; POAG primary open angle glaucoma; EBMD epithelial/anterior basement membrane dystrophy; ACIOL anterior chamber intraocular lens; IOL intraocular lens; PCIOL posterior chamber intraocular lens; Phaco/IOL phacoemulsification with intraocular lens placement; PRK photorefractive  keratectomy; LASIK laser assisted in situ keratomileusis; HTN hypertension; DM diabetes mellitus;  COPD chronic obstructive pulmonary disease

## 2021-01-25 ENCOUNTER — Other Ambulatory Visit: Payer: Medicare Other

## 2021-01-25 ENCOUNTER — Other Ambulatory Visit: Payer: Self-pay

## 2021-01-25 ENCOUNTER — Ambulatory Visit (INDEPENDENT_AMBULATORY_CARE_PROVIDER_SITE_OTHER): Payer: Medicare Other | Admitting: Ophthalmology

## 2021-01-25 ENCOUNTER — Encounter (INDEPENDENT_AMBULATORY_CARE_PROVIDER_SITE_OTHER): Payer: Self-pay | Admitting: Ophthalmology

## 2021-01-25 DIAGNOSIS — H3581 Retinal edema: Secondary | ICD-10-CM

## 2021-01-25 DIAGNOSIS — Z961 Presence of intraocular lens: Secondary | ICD-10-CM | POA: Diagnosis not present

## 2021-01-25 DIAGNOSIS — H35372 Puckering of macula, left eye: Secondary | ICD-10-CM | POA: Diagnosis not present

## 2021-01-25 DIAGNOSIS — H33199 Other retinoschisis and retinal cysts, unspecified eye: Secondary | ICD-10-CM | POA: Diagnosis not present

## 2021-01-25 DIAGNOSIS — H43812 Vitreous degeneration, left eye: Secondary | ICD-10-CM | POA: Diagnosis not present

## 2021-03-01 DIAGNOSIS — Z23 Encounter for immunization: Secondary | ICD-10-CM | POA: Diagnosis not present

## 2021-03-28 ENCOUNTER — Other Ambulatory Visit: Payer: Medicare Other

## 2021-04-05 DIAGNOSIS — Z20822 Contact with and (suspected) exposure to covid-19: Secondary | ICD-10-CM | POA: Diagnosis not present

## 2021-04-27 DIAGNOSIS — U071 COVID-19: Secondary | ICD-10-CM | POA: Diagnosis not present

## 2021-08-05 DIAGNOSIS — L237 Allergic contact dermatitis due to plants, except food: Secondary | ICD-10-CM | POA: Diagnosis not present

## 2021-08-15 DIAGNOSIS — H35372 Puckering of macula, left eye: Secondary | ICD-10-CM | POA: Diagnosis not present

## 2021-08-15 DIAGNOSIS — Z961 Presence of intraocular lens: Secondary | ICD-10-CM | POA: Diagnosis not present

## 2021-08-15 DIAGNOSIS — H10413 Chronic giant papillary conjunctivitis, bilateral: Secondary | ICD-10-CM | POA: Diagnosis not present

## 2021-08-15 DIAGNOSIS — H43812 Vitreous degeneration, left eye: Secondary | ICD-10-CM | POA: Diagnosis not present

## 2021-08-15 NOTE — Progress Notes (Unsigned)
Subjective:   Michelle Cook is a 71 y.o. female who presents for Medicare Annual (Subsequent) preventive examination.  Review of Systems           Objective:    There were no vitals filed for this visit. There is no height or weight on file to calculate BMI.     07/24/2020    8:15 AM 05/29/2020    9:41 AM 12/21/2018   12:21 PM  Advanced Directives  Does Patient Have a Medical Advance Directive? Yes Yes Yes  Type of Estate agent of [place];Living will Living will;Healthcare Power of [employer] Power of [place];Living will  Does patient want to make changes to medical advance directive? No - Patient declined No - Patient declined   Copy of Healthcare Power of Attorney in Chart? No - copy requested No - copy requested No - copy requested    Current Medications (verified) Outpatient Encounter Medications as of 08/16/2021  Medication Sig   Ascorbic Acid (VITAMIN C) 1000 MG tablet Take 1,000 mg by mouth daily.   Cholecalciferol (VITAMIN D PO) Take by mouth.   famotidine (PEPCID) 10 MG tablet Take 10 mg by mouth 2 (two) times daily.   Probiotic Product (PROBIOTIC DAILY) CAPS Take 1 each by mouth daily.   No facility-administered encounter medications on file as of 08/16/2021.    Allergies (verified) Sulfamethoxazole-trimethoprim and Sulfa antibiotics   History: Past Medical History:  Diagnosis Date   Chicken pox    GERD (gastroesophageal reflux disease)    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   STD (sexually transmitted disease)    Herpes   Past Surgical History:  Procedure Laterality Date   TUBAL LIGATION     Family History  Problem Relation Age of Onset   COPD Mother    Heart failure Mother    Hypertension Mother    Hypertension Sister    Hypertension Brother    Social History   Socioeconomic History   Marital status: Single    Spouse name: Not on file   Number of children: 2   Years of  education: 12   Highest education level: Not on file  Occupational History   Occupation: retired  Tobacco Use   Smoking status: Never   Smokeless tobacco: Never  Vaping Use   Vaping Use: Never used  Substance and Sexual Activity   Alcohol use: Yes    Alcohol/week: 0.0 standard drinks of alcohol    Comment: occasionally   Drug use: No   Sexual activity: Not Currently    Comment: 1st intercourse 32 yo-1 partner  Other Topics Concern   Not on file  Social History Narrative   Fun: [employer] tennis, [place]   Denies abuse and feels safe at home.    Looking work part time.    Bake cakes.    Social Determinants of Health   Financial Resource Strain: Low Risk  (05/29/2020)   Overall Financial Resource Strain (CARDIA)    Difficulty of Paying Living Expenses: Not hard at all  Food Insecurity: No Food Insecurity (05/29/2020)   Hunger Vital Sign    Worried About Running Out of Food in the Last Year: Never true    Ran Out of Food in the Last Year: Never true  Transportation Needs: No Transportation Needs (05/29/2020)   PRAPARE - Administrator, Civil Service (Medical): No    Lack of Transportation (Non-Medical): No  Physical Activity: Sufficiently Active (  05/29/2020)   Exercise Vital Sign    Days of Exercise per Week: 5 days    Minutes of Exercise per Session: 30 min  Stress: No Stress Concern Present (05/29/2020)   [employer] of Occupational Health - Occupational Stress Questionnaire    Feeling of Stress : Not at all  Social Connections: Moderately Integrated (05/29/2020)   Social Connection and Isolation Panel [NHANES]    Frequency of Communication with Friends and Family: More than three times a week    Frequency of Social Gatherings with Friends and Family: More than three times a week    Attends Religious Services: More than 4 times per year    Active Member of [employer] or Organizations: Yes    Attends Engineer, structural: More than 4 times per year    Marital  Status: Divorced    Tobacco Counseling Counseling given: Not Answered   Clinical Intake:                 Diabetic?No         Activities of Daily Living     No data to display          Patient Care Team: Olive Bass, FNP as PCP - General (Internal Medicine) Dione Booze, Bertram Millard, MD as Consulting Physician (Ophthalmology)  Indicate any recent Medical Services you may have received from other than [facility] providers in the past year (date may be approximate).     Assessment:   This is a routine wellness examination for Michelle Cook.  Hearing/Vision screen No results found.  Dietary issues and exercise activities discussed:     Goals Addressed   None    Depression Screen    05/29/2020    9:40 AM 12/21/2018   12:28 PM 05/29/2015    8:20 AM 06/24/2013    1:19 PM  PHQ 2/9 Scores  PHQ - 2 Score 0 0 0 0    Fall Risk    07/20/2020    8:19 AM 05/29/2020    9:41 AM 12/21/2018   12:27 PM 12/07/2017    3:40 PM  Fall Risk   Falls in the past year? 0 0 0 No  Number falls in past yr: 0 0 0   Injury with Fall? 0 0 0   Risk for fall due to : No Fall Risks No Fall Risks    Follow up Falls evaluation completed Falls evaluation completed      FALL RISK PREVENTION PERTAINING TO THE HOME:  Any stairs in or around the home? {YES/NO:21197} If so, are there any without handrails? {YES/NO:21197} Home free of loose throw rugs in walkways, pet beds, electrical cords, etc? {YES/NO:21197} Adequate lighting in your home to reduce risk of falls? {YES/NO:21197}  ASSISTIVE DEVICES UTILIZED TO PREVENT FALLS:  Life alert? {YES/NO:21197} Use of a cane, walker or w/c? {YES/NO:21197} Grab bars in the bathroom? {YES/NO:21197} Shower chair or bench in shower? {YES/NO:21197} Elevated toilet seat or a handicapped toilet? {YES/NO:21197}  TIMED UP AND GO:  Was the test performed? {YES/NO:21197}.  Length of time to ambulate 10 feet: *** sec.   {Appearance of  OEVO:3500938}  Cognitive Function:        Immunizations Immunization History  Administered Date(s) Administered   Fluad Quad(high Dose 65+) 12/21/2018   Influenza, High Dose Seasonal PF 03/31/2018   PFIZER(Purple Top)SARS-COV-2 Vaccination 03/24/2019, 04/12/2019, 11/28/2019   Pneumococcal Conjugate-13 03/11/2016   Pneumococcal Polysaccharide-23 05/29/2020   Td 12/02/2008   Tdap 05/08/2017    TDAP status: Up  to date  {Flu Vaccine status:2101806}  Pneumococcal vaccine status: Up to date  {Covid-19 vaccine status:2101808}  Qualifies for Shingles Vaccine? {YES/NO:21197}  Zostavax completed {YES/NO:21197}  {Shingrix Completed?:2101804}  Screening Tests Health Maintenance  Topic Date Due   Zoster Vaccines- Shingrix (1 of 2) Never done   COVID-19 Vaccine (4 - Pfizer series) 01/23/2020   INFLUENZA VACCINE  09/24/2021   MAMMOGRAM  10/11/2022   Fecal DNA (Cologuard)  08/08/2023   TETANUS/TDAP  05/09/2027   Pneumonia Vaccine 66+ Years old  Completed   DEXA SCAN  Completed   Hepatitis C Screening  Completed   HPV VACCINES  Aged Out    Health Maintenance  Health Maintenance Due  Topic Date Due   Zoster Vaccines- Shingrix (1 of 2) Never done   COVID-19 Vaccine (4 - Pfizer series) 01/23/2020    Colorectal cancer screening: Type of screening: Cologuard. Completed 08/07/20. Repeat every 3 years  Mammogram status: Completed 10/10/20. Repeat every year  {Bone Density status:21018021}  Lung Cancer Screening: (Low Dose CT Chest recommended if Age 39-80 years, 30 pack-year currently smoking OR have quit w/in 15years.) does not qualify.   Lung Cancer Screening Referral: n/a  Additional Screening:  Hepatitis C Screening: does qualify; Completed 07/20/20  Vision Screening: Recommended annual ophthalmology exams for early detection of glaucoma and other disorders of the eye. Is the patient up to date with their annual eye exam?  {YES/NO:21197} Who is the provider or what is  the name of the office in which the patient attends annual eye exams? *** If pt is not established with a provider, would they like to be referred to a provider to establish care? {YES/NO:21197}.   Dental Screening: Recommended annual dental exams for proper oral hygiene  Community Resource Referral / Chronic Care Management: CRR required this visit?  {YES/NO:21197}  CCM required this visit?  {YES/NO:21197}     Plan:     I have personally reviewed and noted the following in the patient's chart:   Medical and social history Use of alcohol, tobacco or illicit drugs  Current medications and supplements including opioid prescriptions.  Functional ability and status Nutritional status Physical activity Advanced directives List of other physicians Hospitalizations, surgeries, and ER visits in previous 12 months Vitals Screenings to include cognitive, depression, and falls Referrals and appointments  In addition, I have reviewed and discussed with patient certain preventive protocols, quality metrics, and best practice recommendations. A written personalized care plan for preventive services as well as general preventive health recommendations were provided to patient.     Salomon Mast Emilina Smarr, CMA   08/15/2021   Nurse Notes: ***

## 2021-08-16 ENCOUNTER — Ambulatory Visit (INDEPENDENT_AMBULATORY_CARE_PROVIDER_SITE_OTHER): Payer: Medicare Other

## 2021-08-16 DIAGNOSIS — Z1231 Encounter for screening mammogram for malignant neoplasm of breast: Secondary | ICD-10-CM | POA: Diagnosis not present

## 2021-08-16 DIAGNOSIS — Z780 Asymptomatic menopausal state: Secondary | ICD-10-CM | POA: Diagnosis not present

## 2021-08-16 DIAGNOSIS — Z0000 Encounter for general adult medical examination without abnormal findings: Secondary | ICD-10-CM | POA: Diagnosis not present

## 2021-08-30 ENCOUNTER — Ambulatory Visit (INDEPENDENT_AMBULATORY_CARE_PROVIDER_SITE_OTHER): Payer: Medicare Other | Admitting: Family

## 2021-08-30 ENCOUNTER — Encounter: Payer: Medicare Other | Admitting: Family

## 2021-08-30 VITALS — BP 98/64 | HR 68 | Temp 98.2°F | Resp 16 | Ht 64.0 in | Wt 124.2 lb

## 2021-08-30 DIAGNOSIS — R7989 Other specified abnormal findings of blood chemistry: Secondary | ICD-10-CM

## 2021-08-30 DIAGNOSIS — R5383 Other fatigue: Secondary | ICD-10-CM

## 2021-08-30 DIAGNOSIS — E559 Vitamin D deficiency, unspecified: Secondary | ICD-10-CM

## 2021-08-30 DIAGNOSIS — Z13220 Encounter for screening for lipoid disorders: Secondary | ICD-10-CM

## 2021-08-30 LAB — CBC WITH DIFFERENTIAL/PLATELET
Basophils Absolute: 0.1 10*3/uL (ref 0.0–0.1)
Basophils Relative: 2.1 % (ref 0.0–3.0)
Eosinophils Absolute: 0.2 10*3/uL (ref 0.0–0.7)
Eosinophils Relative: 4.6 % (ref 0.0–5.0)
HCT: 40.3 % (ref 36.0–46.0)
Hemoglobin: 13.3 g/dL (ref 12.0–15.0)
Lymphocytes Relative: 34.3 % (ref 12.0–46.0)
Lymphs Abs: 1.4 10*3/uL (ref 0.7–4.0)
MCHC: 33 g/dL (ref 30.0–36.0)
MCV: 85.8 fl (ref 78.0–100.0)
Monocytes Absolute: 0.7 10*3/uL (ref 0.1–1.0)
Monocytes Relative: 16.1 % — ABNORMAL HIGH (ref 3.0–12.0)
Neutro Abs: 1.8 10*3/uL (ref 1.4–7.7)
Neutrophils Relative %: 42.9 % — ABNORMAL LOW (ref 43.0–77.0)
Platelets: 291 10*3/uL (ref 150.0–400.0)
RBC: 4.7 Mil/uL (ref 3.87–5.11)
RDW: 13.7 % (ref 11.5–15.5)
WBC: 4.1 10*3/uL (ref 4.0–10.5)

## 2021-08-30 LAB — COMPREHENSIVE METABOLIC PANEL
ALT: 38 U/L — ABNORMAL HIGH (ref 0–35)
AST: 33 U/L (ref 0–37)
Albumin: 4.1 g/dL (ref 3.5–5.2)
Alkaline Phosphatase: 96 U/L (ref 39–117)
BUN: 17 mg/dL (ref 6–23)
CO2: 28 mEq/L (ref 19–32)
Calcium: 9.6 mg/dL (ref 8.4–10.5)
Chloride: 105 mEq/L (ref 96–112)
Creatinine, Ser: 0.85 mg/dL (ref 0.40–1.20)
GFR: 69.08 mL/min (ref >60.00)
Glucose, Bld: 99 mg/dL (ref 70–99)
Potassium: 4.4 mEq/L (ref 3.5–5.1)
Sodium: 140 mEq/L (ref 135–145)
Total Bilirubin: 0.8 mg/dL (ref 0.2–1.2)
Total Protein: 6.7 g/dL (ref 6.0–8.3)

## 2021-08-30 LAB — LIPID PANEL
Cholesterol: 197 mg/dL (ref 0–200)
HDL: 47.3 mg/dL (ref >39.00)
LDL Cholesterol: 134 mg/dL — ABNORMAL HIGH (ref 0–99)
NonHDL: 150.13
Total CHOL/HDL Ratio: 4
Triglycerides: 82 mg/dL (ref 0.0–149.0)
VLDL: 16.4 mg/dL (ref 0.0–40.0)

## 2021-08-30 LAB — VITAMIN D 25 HYDROXY (VIT D DEFICIENCY, FRACTURES): VITD: 32.25 ng/mL (ref 30.00–100.00)

## 2021-08-30 MED ORDER — FAMOTIDINE 10 MG PO TABS: 10.0000 mg | ORAL_TABLET | Freq: Two times a day (BID) | ORAL | 3 refills | Status: AC

## 2021-08-30 NOTE — Progress Notes (Signed)
Michelle Cook is a 71 y.o. female with the following history as recorded in EpicCare:  Patient Active Problem List   Diagnosis Date Noted   Abnormality of lung on chest x-ray 08/25/2018   History of bronchitis 08/25/2018   Medicare annual wellness visit, initial 03/11/2016   Routine general medical examination at a health care facility 03/11/2016   Acute bronchitis 01/29/2013   URI 12/12/2009   HYPERLIPIDEMIA 12/06/2008    Current Outpatient Medications  Medication Sig Dispense Refill   Ascorbic Acid (VITAMIN C) 1000 MG tablet Take 1,000 mg by mouth daily.     Cholecalciferol (VITAMIN D PO) Take by mouth.     Probiotic Product (PROBIOTIC DAILY) CAPS Take 1 each by mouth daily.     famotidine (PEPCID) 10 MG tablet Take 1 tablet (10 mg total) by mouth 2 (two) times daily. 180 tablet 3   No current facility-administered medications for this visit.    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   STD (sexually transmitted disease)    Herpes    Past Surgical History:  Procedure Laterality Date   TUBAL LIGATION      Family History  Problem Relation Age of Onset   COPD Mother    Heart failure Mother    Hypertension Mother    Hypertension Sister    Hypertension Brother     Social History   Tobacco Use   Smoking status: Never   Smokeless tobacco: Never  Substance Use Topics   Alcohol use: Yes    Alcohol/week: 0.0 standard drinks of alcohol    Comment: occasionally    Subjective:   Presents for yearly follow up on chronic care needs; will be getting her mammogram and DEXA done by the end of the year; no specific concern; Does see eye doctor and dentist regularly;  + tennis player; 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  Head: Normocephalic and atraumatic  Eyes: Sclera and conjunctiva clear; pupils round and reactive to light; extraocular movements intact  Ears: External normal; canals clear; tympanic membranes normal  Oropharynx: Pink, supple. No suspicious lesions  Neck: Supple without thyromegaly, adenopathy  Lungs: Respirations unlabored; clear to auscultation bilaterally without wheeze, rales, rhonchi  CVS exam: normal rate and regular rhythm.  Abdomen: Soft; nontender; nondistended; normoactive bowel sounds; no masses or hepatosplenomegaly  Musculoskeletal: No deformities; no active joint inflammation  Extremities: No edema, cyanosis, clubbing  Vessels: Symmetric bilaterally  Neurologic: Alert and oriented; speech intact; face symmetrical; moves all extremities well; CNII-XII intact without focal deficit   Assessment:  1. Low vitamin D level   2. Lipid screening   3. Other fatigue   4. Vitamin D deficiency     Plan:  Congratulated patient on commitment to her health; Age appropriate preventive healthcare needs addressed; encouraged regular eye doctor and dental exams; encouraged regular exercise; will update labs and refills as needed today;  follow-up to be determined;  Time spent 30 minutes   No follow-ups on file.  Orders Placed This Encounter  Procedures   CBC with Differential/Platelet   Comp Met (CMET)   Lipid panel   Vitamin D (25 hydroxy)    Requested Prescriptions   Signed Prescriptions Disp Refills   famotidine (PEPCID) 10 MG tablet 180 tablet 3    Sig: Take 1 tablet (10 mg total) by mouth 2 (two) times daily.

## 2021-10-11 ENCOUNTER — Ambulatory Visit: Payer: Medicare Other

## 2021-10-22 ENCOUNTER — Ambulatory Visit
Admission: RE | Admit: 2021-10-22 | Discharge: 2021-10-22 | Disposition: A | Discharge status: Home / Self Care | Payer: Medicare Other | Source: Ambulatory Visit | Attending: Family | Admitting: Family

## 2021-10-22 DIAGNOSIS — Z1231 Encounter for screening mammogram for malignant neoplasm of breast: Secondary | ICD-10-CM

## 2021-11-27 DIAGNOSIS — Z23 Encounter for immunization: Secondary | ICD-10-CM | POA: Diagnosis not present

## 2021-12-13 DIAGNOSIS — Z6821 Body mass index (BMI) 21.0-21.9, adult: Secondary | ICD-10-CM | POA: Diagnosis not present

## 2021-12-13 DIAGNOSIS — N3001 Acute cystitis with hematuria: Secondary | ICD-10-CM | POA: Diagnosis not present

## 2021-12-27 DIAGNOSIS — Z23 Encounter for immunization: Secondary | ICD-10-CM | POA: Diagnosis not present

## 2022-02-06 ENCOUNTER — Ambulatory Visit
Admission: RE | Admit: 2022-02-06 | Discharge: 2022-02-06 | Disposition: A | Discharge status: Home / Self Care | Payer: Medicare Other | Source: Ambulatory Visit | Attending: Family | Admitting: Family

## 2022-02-06 DIAGNOSIS — M810 Age-related osteoporosis without current pathological fracture: Secondary | ICD-10-CM | POA: Diagnosis not present

## 2022-02-06 DIAGNOSIS — M85852 Other specified disorders of bone density and structure, left thigh: Secondary | ICD-10-CM | POA: Diagnosis not present

## 2022-02-06 DIAGNOSIS — Z780 Asymptomatic menopausal state: Secondary | ICD-10-CM | POA: Diagnosis not present

## 2022-02-07 ENCOUNTER — Telehealth: Payer: Medicare Other | Admitting: *Deleted

## 2022-02-07 NOTE — Telephone Encounter (Signed)
-----   Message from Olive Bass, FNP sent at 02/06/2022 12:35 PM EST ----- There has been marked bone loss since her last DEXA in 2019. I would recommend that we start her on a medication to help strengthen her bones/ improve bone density. If we can get it approved, it would be a medicine called Prolia ( injectable) that is done twice a year. Totally appropriate if she wants to discuss further at [facility].

## 2022-02-07 NOTE — Telephone Encounter (Signed)
Patient would like to start the process to get Prolia

## 2022-02-07 NOTE — Telephone Encounter (Signed)
Please check [employer] for prolia benefits.

## 2022-02-07 NOTE — Telephone Encounter (Signed)
Prolia VOB initiated via [email or web address] 

## 2022-02-11 ENCOUNTER — Encounter: Payer: Self-pay | Admitting: Family

## 2022-02-11 ENCOUNTER — Ambulatory Visit (INDEPENDENT_AMBULATORY_CARE_PROVIDER_SITE_OTHER): Payer: Medicare Other | Admitting: Family

## 2022-02-11 ENCOUNTER — Telehealth: Payer: Self-pay

## 2022-02-11 VITALS — BP 118/74 | HR 67 | Temp 97.8°F | Resp 18 | Ht 64.0 in | Wt 124.4 lb

## 2022-02-11 DIAGNOSIS — M810 Age-related osteoporosis without current pathological fracture: Secondary | ICD-10-CM | POA: Diagnosis not present

## 2022-02-11 MED ORDER — DENOSUMAB 60 MG/ML ~~LOC~~ SOSY: 60.0000 mg | PREFILLED_SYRINGE | Freq: Once | SUBCUTANEOUS | 0 refills | Status: AC

## 2022-02-11 NOTE — Patient Instructions (Signed)
Please start taking Caltrate D twice a day;

## 2022-02-11 NOTE — Telephone Encounter (Signed)
Pt scheduled  

## 2022-02-11 NOTE — Telephone Encounter (Signed)
Pt ready for scheduling on or after 02/11/22  Out-of-pocket cost due at time of visit: $0  Primary: [place] Medicare Prolia co-insurance: 0% Admin fee co-insurance: 20% - Covered by Secondary  Secondary: [employer]-MED[employer] Prolia co-insurance:  Admin fee co-insurance:   Deductible: $226 met of $226 required  Prior Auth:  PA# Valid:   ** This summary of benefits is an estimation of the patient's out-of-pocket cost. Exact cost may vary based on individual plan coverage.

## 2022-02-11 NOTE — Telephone Encounter (Signed)
Can we run her in [employer] to check her for prolia- her Tscore was 4.4.

## 2022-02-11 NOTE — Progress Notes (Signed)
  Michelle Cook is a 71 y.o. female with the following history as recorded in [employer]:  Patient Active Problem List   Diagnosis Date Noted   Abnormality of lung on chest x-ray 08/25/2018   History of bronchitis 08/25/2018   Medicare annual wellness visit, initial 03/11/2016   Routine general medical examination at a health care facility 03/11/2016   Acute bronchitis 01/29/2013   URI 12/12/2009   HYPERLIPIDEMIA 12/06/2008    Current Outpatient Medications  Medication Sig Dispense Refill   Ascorbic Acid (VITAMIN C) 1000 MG tablet Take 1,000 mg by mouth daily.     Cholecalciferol (VITAMIN D PO) Take by mouth.     denosumab (PROLIA) 60 MG/ML SOSY injection Inject 60 mg into the skin once for 1 dose. 1 mL 0   famotidine (PEPCID) 10 MG tablet Take 1 tablet (10 mg total) by mouth 2 (two) times daily. 180 tablet 3   Probiotic Product (PROBIOTIC DAILY) CAPS Take 1 each by mouth daily.     No current facility-administered medications for this visit.    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   STD (sexually transmitted disease)    Herpes    Past Surgical History:  Procedure Laterality Date   TUBAL LIGATION      Family History  Problem Relation Age of Onset   COPD Mother    Heart failure Mother    Hypertension Mother    Hypertension Sister    Hypertension Brother     Social History   Tobacco Use   Smoking status: Never   Smokeless tobacco: Never  Substance Use Topics   Alcohol use: Yes    Alcohol/week: 0.0 standard drinks of alcohol    Comment: occasionally    Subjective:  Review bone density- discuss treatment options; recent  DEXA showed T-score of -4.4; is active and tries to eat healthy;   Objective:  Vitals:   02/11/22 0810  BP: 118/74  Pulse: 67  Resp: 18  Temp: 97.8 F  (36.6 C)  TempSrc: Oral  SpO2: 97%  Weight: 124 lb 6.4 oz (56.4 kg)  Height: 5\' 4"  (1.626 m)    General: Well developed, well nourished, in no acute distress  Skin : Warm and dry.  Head: Normocephalic and atraumatic  Lungs: Respirations unlabored;  Neurologic: Alert and oriented; speech intact; face symmetrical; moves all extremities well; CNII-XII intact without focal deficit   Assessment:  1. Osteoporosis without current pathological fracture, unspecified osteoporosis type     Plan:  Reviewed DEXA results with patient; she has done research as requested and agrees that Prolia is a medication she would be comfortable trying; discussed need to take Calcium/ Vitamin D; plan for DEXA in 2024;   Time spent reviewing results/ discussing treatment options 30 minutes  No follow-ups on file.  No orders of the defined types were placed in this encounter.   Requested Prescriptions   Signed Prescriptions Disp Refills   denosumab (PROLIA) 60 MG/ML SOSY injection 1 mL 0    Sig: Inject 60 mg into the skin once for 1 dose.

## 2022-02-13 ENCOUNTER — Telehealth: Payer: Self-pay

## 2022-02-13 ENCOUNTER — Encounter: Payer: Self-pay | Admitting: Family

## 2022-02-13 ENCOUNTER — Ambulatory Visit (INDEPENDENT_AMBULATORY_CARE_PROVIDER_SITE_OTHER): Payer: Medicare Other

## 2022-02-13 DIAGNOSIS — M810 Age-related osteoporosis without current pathological fracture: Secondary | ICD-10-CM

## 2022-02-13 MED ORDER — DENOSUMAB 60 MG/ML ~~LOC~~ SOSY
60.0000 mg | PREFILLED_SYRINGE | Freq: Once | SUBCUTANEOUS | Status: AC
  Administered 2022-02-13: 60 mg via SUBCUTANEOUS

## 2022-02-13 NOTE — Telephone Encounter (Signed)
Pt received Prolia today. Next injection due: 08/15/22. 

## 2022-02-13 NOTE — Telephone Encounter (Signed)
Pt asked if you had any thoughts on the "OsteoStrong" clinic. She had heard about it and wanted to know your thoughts.   Pt says a response via MyChart will be fine.

## 2022-02-13 NOTE — Progress Notes (Signed)
Michelle Cook is a 71 y.o. female presents to the office today for Prolia injection, per physician's orders. Original order: 02/11/2022 Prolia 60 mg SQ was administered L posterior arm today. Patient tolerated injection. Patient due for follow up labs/provider appt: No.  Patient next injection due: 6 months, appt made No- will schedule in 5 months  Creft, Melton Alar L

## 2022-03-16 IMAGING — MG MM DIGITAL SCREENING BILAT W/ TOMO AND CAD
6 of 10 series · 6 of 30 positions shown · non-contrast
Comparison: Previous exam(s).

CLINICAL DATA: Screening.

EXAM:
DIGITAL SCREENING BILATERAL MAMMOGRAM WITH TOMOSYNTHESIS AND CAD
TECHNIQUE: Bilateral screening digital craniocaudal and mediolateral oblique
mammograms were obtained. Bilateral screening digital breast
tomosynthesis was performed. The images were evaluated with
computer-aided detection.

[L CC synth-2D]
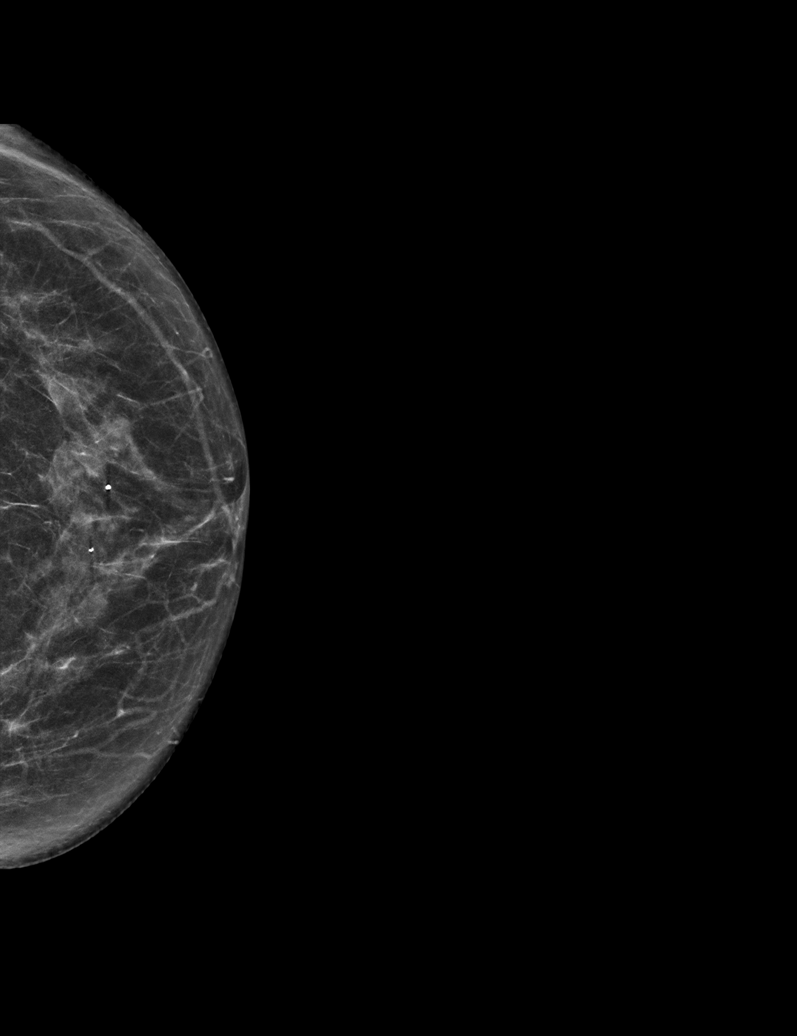

[R MLO synth-2D]
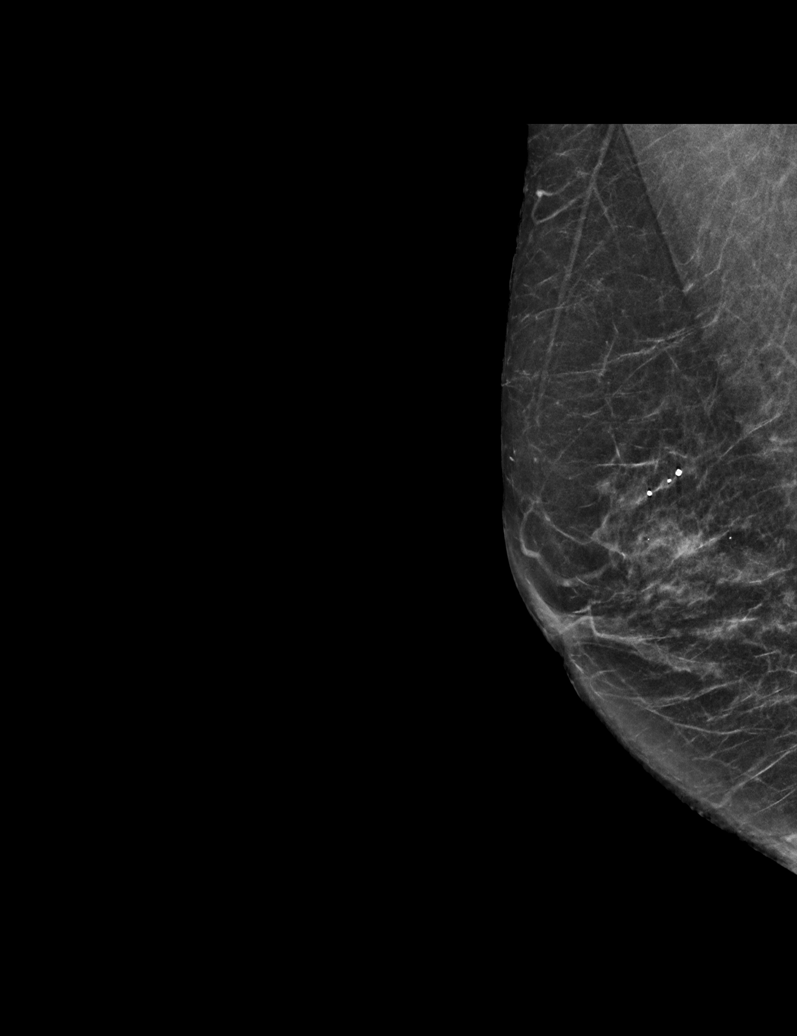

[L MLO synth-2D]
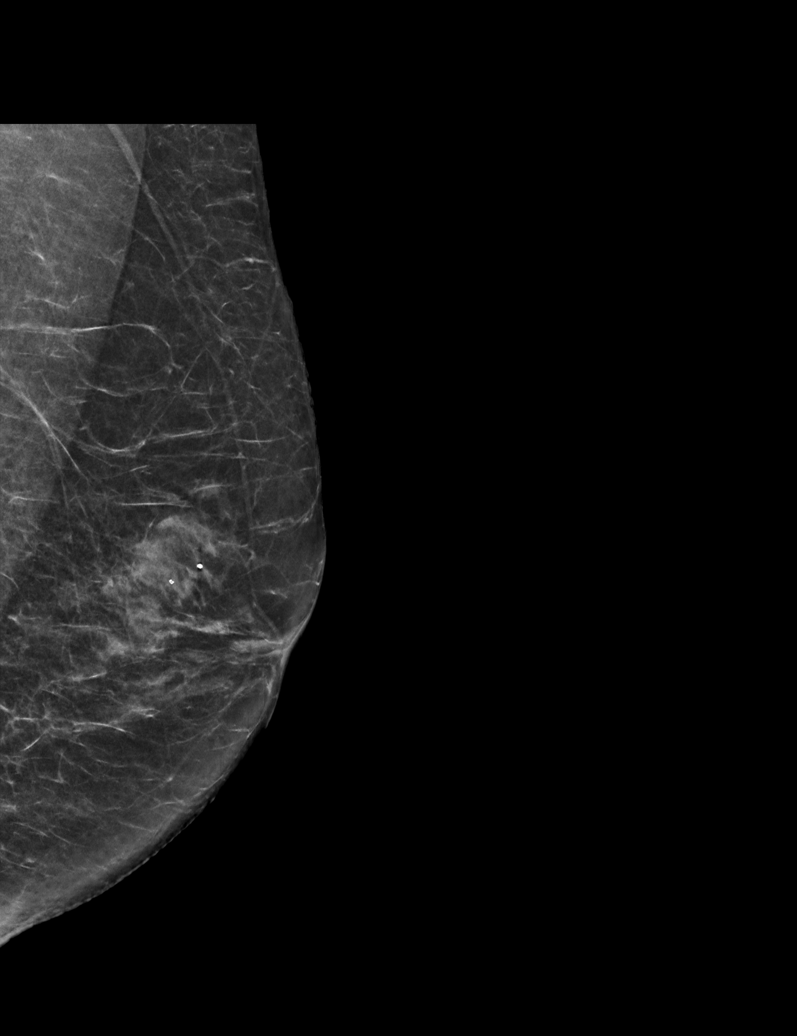

[R CC synth-2D (1 of 2)]
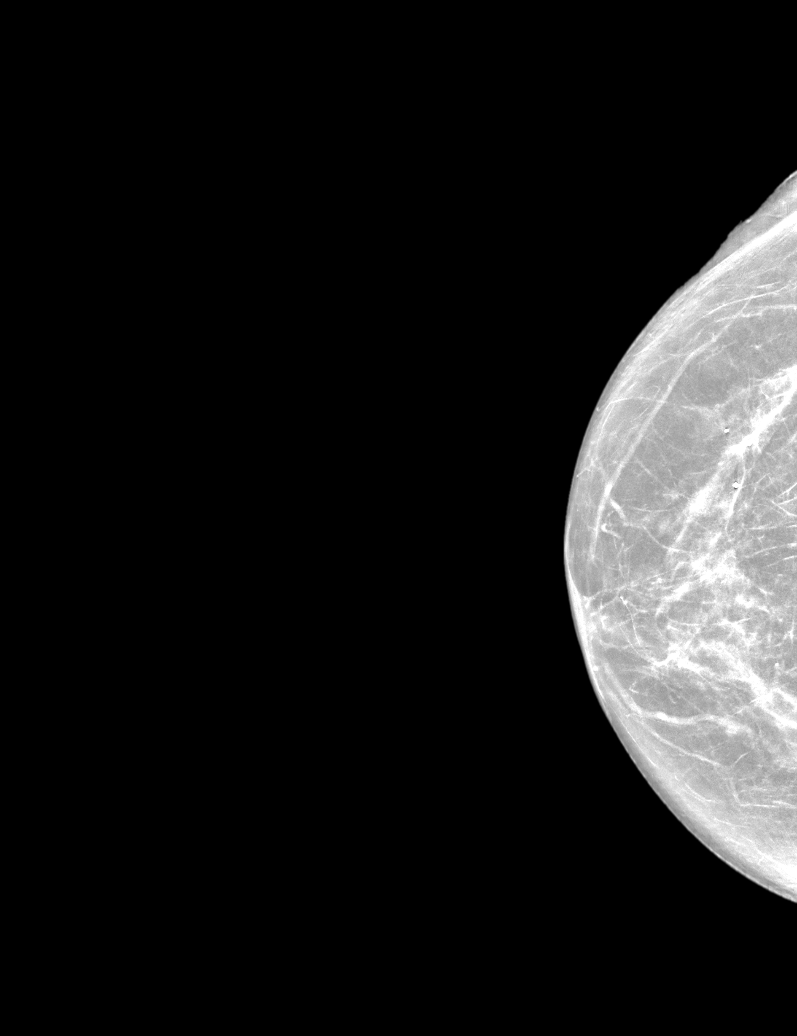

[R CC synth-2D (2 of 2)]
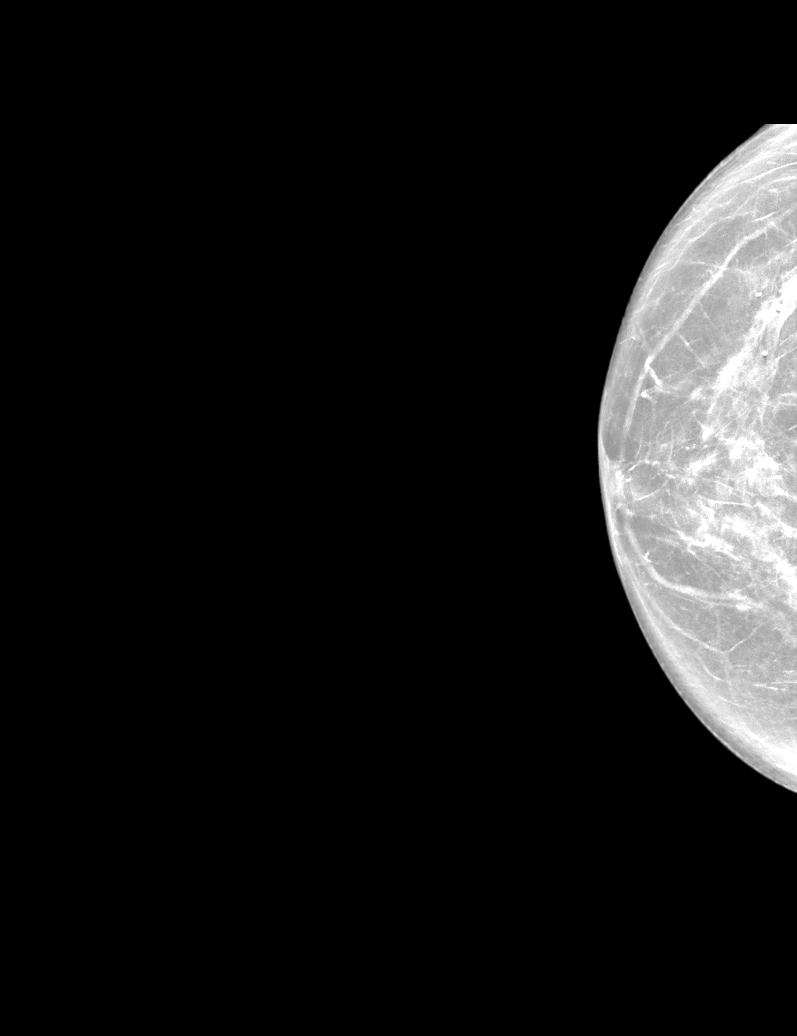

[R MLO tomo · tomo slice 27/52.0]
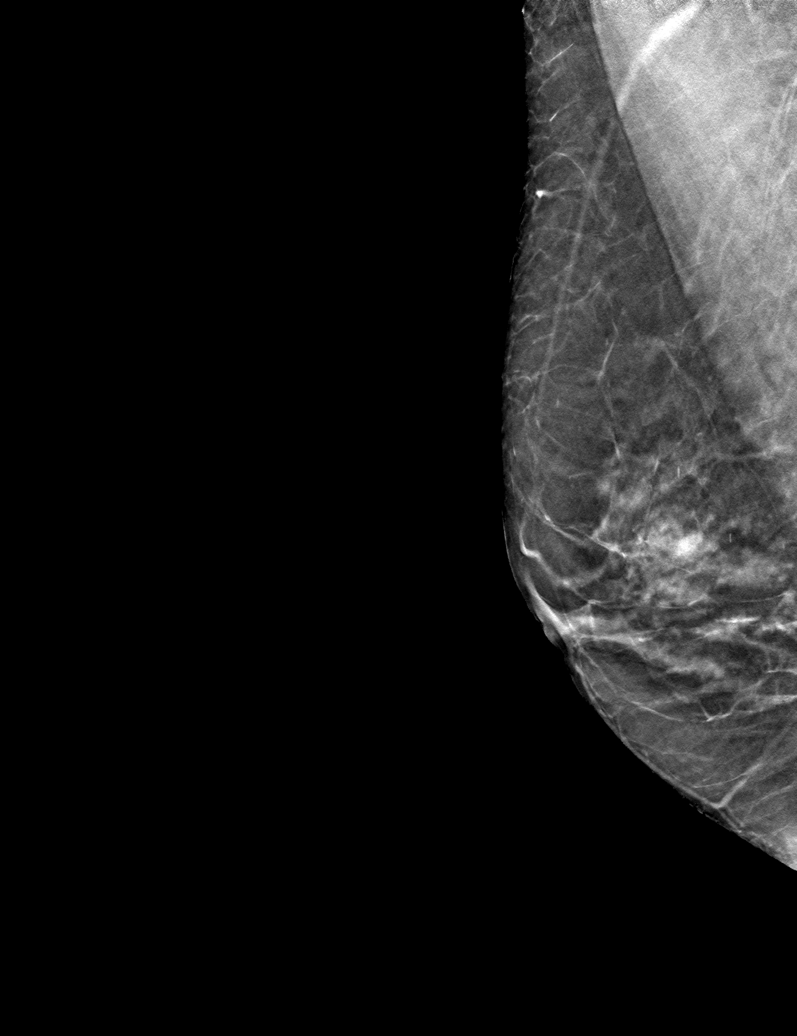

[6 of 30 positions shown; findings below may reference images not displayed]

ACR Breast Density Category b: There are scattered areas of
fibroglandular density.
FINDINGS: There are no findings suspicious for malignancy.
IMPRESSION: No mammographic evidence of malignancy. A result letter of this
screening mammogram will be mailed directly to the patient.

RECOMMENDATION:
Screening mammogram in one year. (Code:51-O-LD2)

BI-RADS CATEGORY  1: Negative.

## 2022-04-27 ENCOUNTER — Encounter: Payer: Self-pay | Admitting: Family

## 2022-04-28 ENCOUNTER — Telehealth (INDEPENDENT_AMBULATORY_CARE_PROVIDER_SITE_OTHER): Payer: Medicare Other | Admitting: Family Medicine

## 2022-04-28 ENCOUNTER — Encounter: Payer: Self-pay | Admitting: Family Medicine

## 2022-04-28 DIAGNOSIS — U071 COVID-19: Secondary | ICD-10-CM | POA: Diagnosis not present

## 2022-04-28 MED ORDER — BENZONATATE 100 MG PO CAPS: 100.0000 mg | ORAL_CAPSULE | Freq: Three times a day (TID) | ORAL | 0 refills | Status: DC | PRN

## 2022-04-28 MED ORDER — NIRMATRELVIR/RITONAVIR (PAXLOVID)TABLET: 3.0000 | ORAL_TABLET | Freq: Two times a day (BID) | ORAL | 0 refills | Status: AC

## 2022-04-28 NOTE — Progress Notes (Signed)
MyChart Video Visit    Virtual Visit via Video Notehea   This visit type was conducted due to national recommendations for restrictions regarding the COVID-19 Pandemic (e.g. social distancing) in an effort to limit this patient's exposure and mitigate transmission in our community. This patient is at least at moderate risk for complications without adequate follow up. This format is felt to be most appropriate for this patient at this time. Physical exam was limited by quality of the video and audio technology used for the visit. heather was able to get the patient set up on a video visit.  Patient location: home Patient and provider in visit Provider location: Office  I discussed the limitations of evaluation and management by telemedicine and the availability of in person appointments. The patient expressed understanding and agreed to proceed.  Visit Date: 04/28/2022  Today's healthcare provider: Ann Held, DO     Subjective:    Patient ID: Michelle Cook, female    DOB: 09/20/1950, 72 y.o.   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   STD (sexually transmitted disease)    Herpes    Past Surgical History:  Procedure Laterality Date   TUBAL LIGATION      Family History  Problem Relation Age of Onset   COPD Mother    Heart failure Mother    Hypertension Mother    Hypertension Sister    Hypertension Brother     Social History   Socioeconomic History   Marital status: Single    Spouse name: Not on file   Number of children: 2   Years of education: 12   Highest education level: Not on file  Occupational History   Occupation: retired  Tobacco Use    Smoking status: Never   Smokeless tobacco: Never  Vaping Use   Vaping Use: Never used  Substance and Sexual Activity   Alcohol use: Yes    Alcohol/week: 0.0 standard drinks of alcohol    Comment: occasionally   Drug use: No   Sexual activity: Not Currently    Comment: 1st intercourse 27 yo-1 partner  Other Topics Concern   Not on file  Social History Narrative   Fun: [employer] tennis, [employer]   Denies abuse and feels safe at home.    Looking work part time.    Bake cakes.    Social Determinants of Health   Financial Resource Strain: Low Risk  (05/29/2020)   Overall Financial Resource Strain (CARDIA)    Difficulty of Paying Living Expenses: Not hard at all  Food Insecurity: No Food Insecurity (05/29/2020)   Hunger Vital Sign    Worried About Running Out of Food in the Last Year: Never true    Ran Out of Food in the Last Year: Never true  Transportation Needs: No Transportation Needs (05/29/2020)   PRAPARE - Hydrologist (Medical): No    Lack of Transportation (Non-Medical): No  Physical Activity: Sufficiently Active (05/29/2020)   Exercise Vital Sign    Days of Exercise per Week: 5 days    Minutes of Exercise  per Session: 30 min  Stress: No Stress Concern Present (05/29/2020)   [place]    Feeling of Stress : Not at all  Social Connections: Moderately Integrated (05/29/2020)   Social Connection and Isolation Panel [NHANES]    Frequency of Communication with Friends and Family: More than three times a week    Frequency of Social Gatherings with Friends and Family: More than three times a week    Attends Religious Services: More than 4 times per year    Active Member of [employer] or Organizations: Yes    Attends Music therapist: More than 4 times per year    Marital Status: Divorced  Intimate Partner Violence: Not At Risk (05/29/2020)   Humiliation, Afraid, Rape, and Kick questionnaire     Fear of Current or Ex-Partner: No    Emotionally Abused: No    Physically Abused: No    Sexually Abused: No    Outpatient Medications Prior to Visit  Medication Sig Dispense Refill   Ascorbic Acid (VITAMIN C) 1000 MG tablet Take 1,000 mg by mouth daily.     famotidine (PEPCID) 10 MG tablet Take 1 tablet (10 mg total) by mouth 2 (two) times daily. 180 tablet 3   Probiotic Product (PROBIOTIC DAILY) CAPS Take 1 each by mouth daily.     Cholecalciferol (VITAMIN D PO) Take by mouth.     No facility-administered medications prior to visit.    Allergies  Allergen Reactions   Sulfamethoxazole-Trimethoprim    Sulfa Antibiotics Rash    Review of Systems  Constitutional:  Negative for fever and malaise/fatigue.  HENT:  Positive for sore throat. Negative for congestion.   Eyes:  Negative for blurred vision.  Respiratory:  Positive for cough. Negative for sputum production, shortness of breath and wheezing.   Cardiovascular:  Negative for chest pain, palpitations and leg swelling.  Gastrointestinal:  Negative for vomiting.  Musculoskeletal:  Negative for back pain.  Skin:  Negative for rash.  Neurological:  Negative for loss of consciousness and headaches.       Objective:    Physical Exam Vitals and nursing note reviewed.  Constitutional:      General: She is not in acute distress.    Appearance: She is well-developed. She is not ill-appearing, toxic-appearing or diaphoretic.  Pulmonary:     Effort: Pulmonary effort is normal.  Neurological:     General: No focal deficit present.     Mental Status: She is alert and oriented to person, place, and time.     There were no vitals taken for this visit. Wt Readings from Last 3 Encounters:  02/11/22 124 lb 6.4 oz (56.4 kg)  08/30/21 124 lb 3.2 oz (56.3 kg)  07/20/20 117 lb 12.8 oz (53.4 kg)       Assessment & Plan:  COVID-19 -     nirmatrelvir/ritonavir; Take 3 tablets by mouth 2 (two) times daily for 5 days. (Take  nirmatrelvir 150 mg two tablets twice daily for 5 days and ritonavir 100 mg one tablet twice daily for 5 days) Patient GFR is 59  Dispense: 30 tablet; Refill: 0  Other orders -     Benzonatate; Take 1 capsule (100 mg total) by mouth 3 (three) times daily as needed for cough.  Dispense: 30 capsule; Refill: 0  Call or return to office if symptoms worsen or do not improve   I discussed the assessment and treatment plan with the patient. The patient  was provided an opportunity to ask questions and all were answered. The patient agreed with the plan and demonstrated an understanding of the instructions.   The patient was advised to call back or seek an in-person evaluation if the symptoms worsen or if the condition fails to improve as anticipated.  [facility] at [facility] [phone] (phone) [phone] (fax)  [place]

## 2022-07-15 ENCOUNTER — Telehealth: Payer: Self-pay

## 2022-07-15 NOTE — Telephone Encounter (Signed)
Please check EOB, pt due for Prolia in 1 month.

## 2022-07-15 NOTE — Telephone Encounter (Signed)
Prolia VOB initiated via [email or web address]  Last Prolia inj: 02/13/22 Next Prolia inj DUE: 08/13/22

## 2022-07-15 NOTE — Telephone Encounter (Signed)
Created new encounter for Prolia BIV. Will route encounter back once benefit verification is complete.  

## 2022-07-18 NOTE — Telephone Encounter (Signed)
Pt ready for scheduling for PROLIA on or after : 08/13/22  Out-of-pocket cost due at time of visit: $154.93  Primary: MEDICARE Prolia co-insurance: 0% Admin fee co-insurance: 0%  Secondary: [employer]-MED[employer]P Prolia co-insurance: covers the Medicare Part B co-insurance and 100% of the excess charges. This plan does not cover the Medicare Part B deductible. Admin fee co-insurance:   Medical Benefit Details: Date Benefits were checked: 07/15/22 Deductible: $85.07 met of $240 required/ Coinsurance: 0%/ Admin Fee: 0%  Prior Auth: N/A PA# Expiration Date:    Pharmacy benefit: Copay $--- If patient wants fill through the pharmacy benefit please send prescription to:  --- , and include estimated need by date in rx notes. Pharmacy will ship medication directly to the office.  Patient NOT eligible for Prolia Copay Card. Copay Card can make patient's cost as little as $25. Link to apply: [email or web address]://[email or web address]  ** This summary of benefits is an estimation of the patient's out-of-pocket cost. Exact cost may very based on individual plan coverage.

## 2022-07-18 NOTE — Telephone Encounter (Signed)
Mychart message sent.

## 2022-08-14 ENCOUNTER — Telehealth: Payer: Self-pay | Admitting: *Deleted

## 2022-08-14 ENCOUNTER — Ambulatory Visit (INDEPENDENT_AMBULATORY_CARE_PROVIDER_SITE_OTHER): Payer: Medicare Other | Admitting: *Deleted

## 2022-08-14 DIAGNOSIS — M810 Age-related osteoporosis without current pathological fracture: Secondary | ICD-10-CM

## 2022-08-14 MED ORDER — DENOSUMAB 60 MG/ML ~~LOC~~ SOSY
60.0000 mg | PREFILLED_SYRINGE | Freq: Once | SUBCUTANEOUS | Status: AC
  Administered 2022-08-14: 60 mg via SUBCUTANEOUS

## 2022-08-14 NOTE — Progress Notes (Signed)
Patient here for prolia injection per physicians orders.  Injection given left subq and patient tolerated well.  

## 2022-08-14 NOTE — Telephone Encounter (Signed)
Prolia given today. 

## 2022-08-20 ENCOUNTER — Ambulatory Visit (INDEPENDENT_AMBULATORY_CARE_PROVIDER_SITE_OTHER): Payer: Medicare Other | Admitting: *Deleted

## 2022-08-20 DIAGNOSIS — M419 Scoliosis, unspecified: Secondary | ICD-10-CM

## 2022-08-20 DIAGNOSIS — Z0000 Encounter for general adult medical examination without abnormal findings: Secondary | ICD-10-CM | POA: Diagnosis not present

## 2022-08-20 DIAGNOSIS — G8929 Other chronic pain: Secondary | ICD-10-CM | POA: Diagnosis not present

## 2022-08-20 DIAGNOSIS — M545 Low back pain, unspecified: Secondary | ICD-10-CM | POA: Diagnosis not present

## 2022-08-20 NOTE — Progress Notes (Signed)
Subjective:   Michelle Cook is a 72 y.o. female who presents for Medicare Annual (Subsequent) preventive examination.  Visit Complete: Virtual  I connected with  Michelle Cook on 08/20/22 by a audio enabled telemedicine application and verified that I am speaking with the correct person using two identifiers.  Patient Location: Home  Provider Location: Office/Clinic  I discussed the limitations of evaluation and management by telemedicine. The patient expressed understanding and agreed to proceed.  Patient Medicare AWV questionnaire was completed by the patient on 08/13/22; I have confirmed that all information answered by patient is correct and no changes since this date.  Review of Systems     Cardiac Risk Factors include: advanced age (>79men, >62 women);dyslipidemia     Objective:    Today's Vitals   There is no height or weight on file to calculate BMI.     08/20/2022    9:03 AM 08/16/2021    9:02 AM 07/24/2020    8:15 AM 05/29/2020    9:41 AM 12/21/2018   12:21 PM  Advanced Directives  Does Patient Have a Medical Advance Directive? Yes Yes Yes Yes Yes  Type of Estate agent of [place];Living will Living will;Out of facility DNR (pink MOST or yellow form) Healthcare Power of [place];Living will Living will;Healthcare Power of [employer] Power of [place];Living will  Does patient want to make changes to medical advance directive?  No - Patient declined No - Patient declined No - Patient declined   Copy of Healthcare Power of Attorney in Chart? No - copy requested  No - copy requested No - copy requested No - copy requested    Current Medications (verified) Outpatient Encounter Medications as of 08/20/2022  Medication Sig   Calcium Carb-Cholecalciferol (CALCIUM PLUS D3 ABSORBABLE) 600-62.5 MG-MCG CAPS    Ascorbic Acid (VITAMIN C) 1000 MG tablet Take 1,000 mg by mouth daily.   famotidine (PEPCID) 10 MG tablet Take 1 tablet (10 mg  total) by mouth 2 (two) times daily.   Probiotic Product (PROBIOTIC DAILY) CAPS Take 1 each by mouth daily.   [DISCONTINUED] benzonatate (TESSALON PERLES) 100 MG capsule Take 1 capsule (100 mg total) by mouth 3 (three) times daily as needed for cough.   No facility-administered encounter medications on file as of 08/20/2022.    Allergies (verified) Sulfamethoxazole-trimethoprim and Sulfa antibiotics   History: Past Medical History:  Diagnosis Date   Chicken pox    GERD (gastroesophageal reflux disease)    Hyperlipidemia    Osteoporosis 01/1922   Diagnosed with osteoporosis in December of 2023.   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   STD (sexually transmitted disease)    Herpes   Past Surgical History:  Procedure Laterality Date   EYE SURGERY  2022   Cataract surgery   TUBAL LIGATION     Family History  Problem Relation Age of Onset   COPD Mother    Heart failure Mother    Hypertension Mother    Hypertension Sister    Hypertension Brother    Social History   Socioeconomic History   Marital status: Single    Spouse name: Not on file   Number of children: 2   Years of education: 12   Highest education level: Not on file  Occupational History   Occupation: retired  Tobacco Use   Smoking status: Never   Smokeless tobacco: Never  Vaping Use   Vaping Use: Never used  Substance and  Sexual Activity   Alcohol use: Yes    Alcohol/week: 1.0 standard drink of alcohol    Types: 1 Glasses of wine per week    Comment: occasionally   Drug use: No   Sexual activity: Not Currently    Comment: 1st intercourse 17 yo-1 partner  Other Topics Concern   Not on file  Social History Narrative   Fun: [employer] tennis, [employer]   Denies abuse and feels safe at home.    Looking work part time.    Bake cakes.    Social Determinants of Health   Financial Resource Strain: Low Risk  (08/13/2022)   Overall Financial Resource Strain (CARDIA)    Difficulty of  Paying Living Expenses: Not hard at all  Food Insecurity: No Food Insecurity (08/13/2022)   Hunger Vital Sign    Worried About Running Out of Food in the Last Year: Never true    Ran Out of Food in the Last Year: Never true  Transportation Needs: No Transportation Needs (08/13/2022)   PRAPARE - Administrator, Civil Service (Medical): No    Lack of Transportation (Non-Medical): No  Physical Activity: Inactive (08/13/2022)   Exercise Vital Sign    Days of Exercise per Week: 0 days    Minutes of Exercise per Session: 0 min  Stress: No Stress Concern Present (08/13/2022)   [employer] of Occupational Health - Occupational Stress Questionnaire    Feeling of Stress : Not at all  Social Connections: Unknown (08/13/2022)   Social Connection and Isolation Panel [NHANES]    Frequency of Communication with Friends and Family: More than three times a week    Frequency of Social Gatherings with Friends and Family: Three times a week    Attends Religious Services: Not on file    Active Member of Clubs or Organizations: Yes    Attends Club or Organization Meetings: 1 to 4 times per year    Marital Status: Divorced    Tobacco Counseling Counseling given: Not Answered   Clinical Intake:  Pre-visit preparation completed: Yes  Pain : No/denies pain  Nutritional Risks: None Diabetes: No  How often do you need to have someone help you when you read instructions, pamphlets, or other written materials from your doctor or pharmacy?: 1 - Never  Interpreter Needed?: No  Information entered by :: Donne Anon, CMA   Activities of Daily Living    08/13/2022    9:09 AM  In your present state of health, do you have any difficulty performing the following activities:  Hearing? 0  Vision? 0  Difficulty concentrating or making decisions? 0  Walking or climbing stairs? 0  Dressing or bathing? 0  Doing errands, shopping? 0  Preparing Food and eating ? N  Using the Toilet? N  In  the past six months, have you accidently leaked urine? N  Do you have problems with loss of bowel control? N  Managing your Medications? N  Managing your Finances? N  Housekeeping or managing your Housekeeping? N    Patient Care Team: Olive Bass, FNP as PCP - General (Internal Medicine) Dione Booze, Bertram Millard, MD as Consulting Physician (Ophthalmology)  Indicate any recent Medical Services you may have received from other than [facility] providers in the past year (date may be approximate).     Assessment:   This is a routine wellness examination for Michelle Cook.  Hearing/Vision screen No results found.  Dietary issues and exercise activities discussed:     Goals Addressed  None    Depression Screen    08/20/2022    9:04 AM 02/11/2022    8:13 AM 08/30/2021   10:03 AM 08/16/2021    9:04 AM 05/29/2020    9:40 AM 12/21/2018   12:28 PM 05/29/2015    8:20 AM  PHQ 2/9 Scores  PHQ - 2 Score 0 0 0 0 0 0 0    Fall Risk    08/13/2022    9:09 AM 02/11/2022    8:13 AM 08/30/2021   10:02 AM 08/16/2021    9:03 AM 07/20/2020    8:19 AM  Fall Risk   Falls in the past year? 0 0 0 0 0  Number falls in past yr: 0 0 0 0 0  Injury with Fall? 0 0 0 0 0  Risk for fall due to : No Fall Risks No Fall Risks  No Fall Risks No Fall Risks  Follow up Falls evaluation completed Falls evaluation completed  Falls evaluation completed Falls evaluation completed    MEDICARE RISK AT HOME:   TIMED UP AND GO:  Was the test performed?  No    Cognitive Function:        08/20/2022    9:05 AM 08/16/2021    9:10 AM  6CIT Screen  What Year? 0 points 0 points  What month? 0 points 0 points  What time? 0 points 0 points  Count back from 20 0 points 0 points  Months in reverse 0 points 0 points  Repeat phrase 0 points 2 points  Total Score 0 points 2 points    Immunizations Immunization History  Administered Date(s) Administered   Fluad Quad(high Dose 65+) 12/21/2018, 11/27/2021   Influenza,  High Dose Seasonal PF 03/31/2018   PFIZER(Purple Top)SARS-COV-2 Vaccination 03/24/2019, 04/12/2019, 11/28/2019   Pfizer Covid-19 Vaccine Bivalent Booster 39yrs & up 03/01/2021   Pneumococcal Conjugate-13 03/11/2016   Pneumococcal Polysaccharide-23 05/29/2020   Td 12/02/2008   Tdap 05/08/2017    TDAP status: Up to date  Flu Vaccine status: Up to date  Pneumococcal vaccine status: Up to date  Covid-19 vaccine status: Information provided on how to obtain vaccines.   Qualifies for Shingles Vaccine? Yes   Zostavax completed No   Shingrix Completed?: No.    Education has been provided regarding the importance of this vaccine. Patient has been advised to call insurance company to determine out of pocket expense if they have not yet received this vaccine. Advised may also receive vaccine at local pharmacy or Health Dept. Verbalized acceptance and understanding.  Screening Tests Health Maintenance  Topic Date Due   Zoster Vaccines- Shingrix (1 of 2) Never done   Medicare Annual Wellness (AWV)  08/17/2022   INFLUENZA VACCINE  09/25/2022   Fecal DNA (Cologuard)  08/08/2023   MAMMOGRAM  10/23/2023   DTaP/Tdap/Td (3 - Td or Tdap) 05/09/2027   Pneumonia Vaccine 66+ Years old  Completed   DEXA SCAN  Completed   Hepatitis C Screening  Completed   HPV VACCINES  Aged Out   COVID-19 Vaccine  Discontinued    Health Maintenance  Health Maintenance Due  Topic Date Due   Zoster Vaccines- Shingrix (1 of 2) Never done   Medicare Annual Wellness (AWV)  08/17/2022    Colorectal cancer screening: Type of screening: Cologuard. Completed 08/07/20. Repeat every 3 years  Mammogram status: Completed 10/22/21. Repeat every year  Bone Density status: Completed 02/06/22. Results reflect: Bone density results: OSTEOPOROSIS. Repeat every 2 years.  Lung Cancer Screening: (  Low Dose CT Chest recommended if Age 99-80 years, 20 pack-year currently smoking OR have quit w/in 15years.) does not qualify.    Additional Screening:  Hepatitis C Screening: does qualify; Completed 07/20/20  Vision Screening: Recommended annual ophthalmology exams for early detection of glaucoma and other disorders of the eye. Is the patient up to date with their annual eye exam?  Yes  Who is the provider or what is the name of the office in which the patient attends annual eye exams? Dr. Dione Booze If pt is not established with a provider, would they like to be referred to a provider to establish care? No .   Dental Screening: Recommended annual dental exams for proper oral hygiene  Diabetic Foot Exam: N/a  Community Resource Referral / Chronic Care Management: CRR required this visit?  No   CCM required this visit?  No     Plan:     I have personally reviewed and noted the following in the patient's chart:   Medical and social history Use of alcohol, tobacco or illicit drugs  Current medications and supplements including opioid prescriptions. Patient is not currently taking opioid prescriptions. Functional ability and status Nutritional status Physical activity Advanced directives List of other physicians Hospitalizations, surgeries, and ER visits in previous 12 months Vitals Screenings to include cognitive, depression, and falls Referrals and appointments  In addition, I have reviewed and discussed with patient certain preventive protocols, quality metrics, and best practice recommendations. A written personalized care plan for preventive services as well as general preventive health recommendations were provided to patient.     Donne Anon, CMA   08/20/2022   After Visit Summary: (MyChart) Due to this being a telephonic visit, the after visit summary with patients personalized plan was offered to patient via MyChart   Nurse Notes: None

## 2022-08-20 NOTE — Patient Instructions (Signed)
Michelle Cook , Thank you for taking time to come for your Medicare Wellness Visit. I appreciate your ongoing commitment to your health goals. Please review the following plan we discussed and let me know if I can assist you in the future.      This is a list of the screening recommended for you and due dates:  Health Maintenance  Topic Date Due   Zoster (Shingles) Vaccine (1 of 2) Never done   Flu Shot  09/25/2022   Cologuard (Stool DNA test)  08/08/2023   Medicare Annual Wellness Visit  08/20/2023   Mammogram  10/23/2023   DTaP/Tdap/Td vaccine (3 - Td or Tdap) 05/09/2027   Pneumonia Vaccine  Completed   DEXA scan (bone density measurement)  Completed   Hepatitis C Screening  Completed   HPV Vaccine  Aged Out   COVID-19 Vaccine  Discontinued    Next appointment: Follow up in one year for your annual wellness visit.   Preventive Care 72 Years and Older, Female Preventive care refers to lifestyle choices and visits with your health care provider that can promote health and wellness. What does preventive care include? A yearly physical exam. This is also called an annual well check. Dental exams once or twice a year. Routine eye exams. Ask your health care provider how often you should have your eyes checked. Personal lifestyle choices, including: Daily care of your teeth and gums. Regular physical activity. Eating a healthy diet. Avoiding tobacco and drug use. Limiting alcohol use. Practicing safe sex. Taking low-dose aspirin every day. Taking vitamin and mineral supplements as recommended by your health care provider. What happens during an annual well check? The services and screenings done by your health care provider during your annual well check will depend on your age, overall health, lifestyle risk factors, and family history of disease. Counseling  Your health care provider may ask you questions about your: Alcohol use. Tobacco use. Drug use. Emotional  well-being. Home and relationship well-being. Sexual activity. Eating habits. History of falls. Memory and ability to understand (cognition). Work and work Astronomer. Reproductive health. Screening  You may have the following tests or measurements: Height, weight, and BMI. Blood pressure. Lipid and cholesterol levels. These may be checked every 5 years, or more frequently if you are over 41 years old. Skin check. Lung cancer screening. You may have this screening every year starting at age 43 if you have a 30-pack-year history of smoking and currently smoke or have quit within the past 15 years. Fecal occult blood test (FOBT) of the stool. You may have this test every year starting at age 63. Flexible sigmoidoscopy or colonoscopy. You may have a sigmoidoscopy every 5 years or a colonoscopy every 10 years starting at age 68. Hepatitis C blood test. Hepatitis B blood test. Sexually transmitted disease (STD) testing. Diabetes screening. This is done by checking your blood sugar (glucose) after you have not eaten for a while (fasting). You may have this done every 1-3 years. Bone density scan. This is done to screen for osteoporosis. You may have this done starting at age 23. Mammogram. This may be done every 1-2 years. Talk to your health care provider about how often you should have regular mammograms. Talk with your health care provider about your test results, treatment options, and if necessary, the need for more tests. Vaccines  Your health care provider may recommend certain vaccines, such as: Influenza vaccine. This is recommended every year. Tetanus, diphtheria, and acellular pertussis (Tdap,  Td) vaccine. You may need a Td booster every 10 years. Zoster vaccine. You may need this after age 37. Pneumococcal 13-valent conjugate (PCV13) vaccine. One dose is recommended after age 2. Pneumococcal polysaccharide (PPSV23) vaccine. One dose is recommended after age 17. Talk to your  health care provider about which screenings and vaccines you need and how often you need them. This information is not intended to replace advice given to you by your health care provider. Make sure you discuss any questions you have with your health care provider. Document Released: 03/09/2015 Document Revised: 10/31/2015 Document Reviewed: 12/12/2014 Elsevier Interactive Patient Education  2017 [place] Prevention in the Home Falls can cause injuries. They can happen to people of all ages. There are many things you can do to make your home safe and to help prevent falls. What can I do on the outside of my home? Regularly fix the edges of walkways and driveways and fix any cracks. Remove anything that might make you trip as you walk through a door, such as a raised step or threshold. Trim any bushes or trees on the path to your home. Use bright outdoor lighting. Clear any walking paths of anything that might make someone trip, such as rocks or tools. Regularly check to see if handrails are loose or broken. Make sure that both sides of any steps have handrails. Any raised decks and porches should have guardrails on the edges. Have any leaves, snow, or ice cleared regularly. Use sand or salt on walking paths during winter. Clean up any spills in your garage right away. This includes oil or grease spills. What can I do in the bathroom? Use night lights. Install grab bars by the toilet and in the tub and shower. Do not use towel bars as grab bars. Use non-skid mats or decals in the tub or shower. If you need to sit down in the shower, use a plastic, non-slip stool. Keep the floor dry. Clean up any water that spills on the floor as soon as it happens. Remove soap buildup in the tub or shower regularly. Attach bath mats securely with double-sided non-slip rug tape. Do not have throw rugs and other things on the floor that can make you trip. What can I do in the bedroom? Use night  lights. Make sure that you have a light by your bed that is easy to reach. Do not use any sheets or blankets that are too big for your bed. They should not hang down onto the floor. Have a firm chair that has side arms. You can use this for support while you get dressed. Do not have throw rugs and other things on the floor that can make you trip. What can I do in the kitchen? Clean up any spills right away. Avoid walking on wet floors. Keep items that you use a lot in easy-to-reach places. If you need to reach something above you, use a strong step stool that has a grab bar. Keep electrical cords out of the way. Do not use floor polish or wax that makes floors slippery. If you must use wax, use non-skid floor wax. Do not have throw rugs and other things on the floor that can make you trip. What can I do with my stairs? Do not leave any items on the stairs. Make sure that there are handrails on both sides of the stairs and use them. Fix handrails that are broken or loose. Make sure that handrails are as  long as the stairways. Check any carpeting to make sure that it is firmly attached to the stairs. Fix any carpet that is loose or worn. Avoid having throw rugs at the top or bottom of the stairs. If you do have throw rugs, attach them to the floor with carpet tape. Make sure that you have a light switch at the top of the stairs and the bottom of the stairs. If you do not have them, ask someone to add them for you. What else can I do to help prevent falls? Wear shoes that: Do not have high heels. Have rubber bottoms. Are comfortable and fit you well. Are closed at the toe. Do not wear sandals. If you use a stepladder: Make sure that it is fully opened. Do not climb a closed stepladder. Make sure that both sides of the stepladder are locked into place. Ask someone to hold it for you, if possible. Clearly mark and make sure that you can see: Any grab bars or handrails. First and last  steps. Where the edge of each step is. Use tools that help you move around (mobility aids) if they are needed. These include: Canes. Walkers. Scooters. Crutches. Turn on the lights when you go into a dark area. Replace any light bulbs as soon as they burn out. Set up your furniture so you have a clear path. Avoid moving your furniture around. If any of your floors are uneven, fix them. If there are any pets around you, be aware of where they are. Review your medicines with your doctor. Some medicines can make you feel dizzy. This can increase your chance of falling. Ask your doctor what other things that you can do to help prevent falls. This information is not intended to replace advice given to you by your health care provider. Make sure you discuss any questions you have with your health care provider. Document Released: 12/07/2008 Document Revised: 07/19/2015 Document Reviewed: 03/17/2014 Elsevier Interactive Patient Education  2017 [employer].

## 2022-08-23 DIAGNOSIS — Z6821 Body mass index (BMI) 21.0-21.9, adult: Secondary | ICD-10-CM | POA: Diagnosis not present

## 2022-08-23 DIAGNOSIS — R300 Dysuria: Secondary | ICD-10-CM | POA: Diagnosis not present

## 2022-10-19 ENCOUNTER — Encounter: Payer: Self-pay | Admitting: Family

## 2022-10-21 ENCOUNTER — Encounter: Payer: Self-pay | Admitting: Family

## 2022-10-21 NOTE — Telephone Encounter (Signed)
Form has been faxed.

## 2022-10-21 NOTE — Telephone Encounter (Signed)
Signed copy of referral has been faxed, and pt copy has been placed up front to mail out to pt.

## 2022-10-29 DIAGNOSIS — M545 Low back pain, unspecified: Secondary | ICD-10-CM | POA: Diagnosis not present

## 2022-10-29 DIAGNOSIS — M25552 Pain in left hip: Secondary | ICD-10-CM | POA: Diagnosis not present

## 2022-10-29 DIAGNOSIS — M6281 Muscle weakness (generalized): Secondary | ICD-10-CM | POA: Diagnosis not present

## 2022-10-29 DIAGNOSIS — M419 Scoliosis, unspecified: Secondary | ICD-10-CM | POA: Diagnosis not present

## 2022-10-29 DIAGNOSIS — M5459 Other low back pain: Secondary | ICD-10-CM | POA: Diagnosis not present

## 2022-11-04 DIAGNOSIS — M6281 Muscle weakness (generalized): Secondary | ICD-10-CM | POA: Diagnosis not present

## 2022-11-04 DIAGNOSIS — M5459 Other low back pain: Secondary | ICD-10-CM | POA: Diagnosis not present

## 2022-11-04 DIAGNOSIS — M25552 Pain in left hip: Secondary | ICD-10-CM | POA: Diagnosis not present

## 2022-11-04 DIAGNOSIS — M419 Scoliosis, unspecified: Secondary | ICD-10-CM | POA: Diagnosis not present

## 2022-11-04 DIAGNOSIS — M545 Low back pain, unspecified: Secondary | ICD-10-CM | POA: Diagnosis not present

## 2022-11-11 DIAGNOSIS — M25552 Pain in left hip: Secondary | ICD-10-CM | POA: Diagnosis not present

## 2022-11-11 DIAGNOSIS — M6281 Muscle weakness (generalized): Secondary | ICD-10-CM | POA: Diagnosis not present

## 2022-11-11 DIAGNOSIS — M419 Scoliosis, unspecified: Secondary | ICD-10-CM | POA: Diagnosis not present

## 2022-11-11 DIAGNOSIS — M5459 Other low back pain: Secondary | ICD-10-CM | POA: Diagnosis not present

## 2022-11-11 DIAGNOSIS — M545 Low back pain, unspecified: Secondary | ICD-10-CM | POA: Diagnosis not present

## 2022-11-18 DIAGNOSIS — M25552 Pain in left hip: Secondary | ICD-10-CM | POA: Diagnosis not present

## 2022-11-18 DIAGNOSIS — M545 Low back pain, unspecified: Secondary | ICD-10-CM | POA: Diagnosis not present

## 2022-11-18 DIAGNOSIS — M6281 Muscle weakness (generalized): Secondary | ICD-10-CM | POA: Diagnosis not present

## 2022-11-18 DIAGNOSIS — M419 Scoliosis, unspecified: Secondary | ICD-10-CM | POA: Diagnosis not present

## 2022-11-18 DIAGNOSIS — M5459 Other low back pain: Secondary | ICD-10-CM | POA: Diagnosis not present

## 2022-11-25 DIAGNOSIS — M6281 Muscle weakness (generalized): Secondary | ICD-10-CM | POA: Diagnosis not present

## 2022-11-25 DIAGNOSIS — M25552 Pain in left hip: Secondary | ICD-10-CM | POA: Diagnosis not present

## 2022-11-25 DIAGNOSIS — M5459 Other low back pain: Secondary | ICD-10-CM | POA: Diagnosis not present

## 2022-11-25 DIAGNOSIS — M545 Low back pain, unspecified: Secondary | ICD-10-CM | POA: Diagnosis not present

## 2022-11-25 DIAGNOSIS — M419 Scoliosis, unspecified: Secondary | ICD-10-CM | POA: Diagnosis not present

## 2022-12-02 DIAGNOSIS — M25552 Pain in left hip: Secondary | ICD-10-CM | POA: Diagnosis not present

## 2022-12-02 DIAGNOSIS — M545 Low back pain, unspecified: Secondary | ICD-10-CM | POA: Diagnosis not present

## 2022-12-02 DIAGNOSIS — M6281 Muscle weakness (generalized): Secondary | ICD-10-CM | POA: Diagnosis not present

## 2022-12-02 DIAGNOSIS — M5459 Other low back pain: Secondary | ICD-10-CM | POA: Diagnosis not present

## 2022-12-02 DIAGNOSIS — M419 Scoliosis, unspecified: Secondary | ICD-10-CM | POA: Diagnosis not present

## 2022-12-09 DIAGNOSIS — M5459 Other low back pain: Secondary | ICD-10-CM | POA: Diagnosis not present

## 2022-12-09 DIAGNOSIS — M25552 Pain in left hip: Secondary | ICD-10-CM | POA: Diagnosis not present

## 2022-12-09 DIAGNOSIS — M545 Low back pain, unspecified: Secondary | ICD-10-CM | POA: Diagnosis not present

## 2022-12-09 DIAGNOSIS — M419 Scoliosis, unspecified: Secondary | ICD-10-CM | POA: Diagnosis not present

## 2022-12-09 DIAGNOSIS — M6281 Muscle weakness (generalized): Secondary | ICD-10-CM | POA: Diagnosis not present

## 2022-12-18 DIAGNOSIS — M419 Scoliosis, unspecified: Secondary | ICD-10-CM | POA: Diagnosis not present

## 2022-12-18 DIAGNOSIS — M5459 Other low back pain: Secondary | ICD-10-CM | POA: Diagnosis not present

## 2022-12-18 DIAGNOSIS — M25552 Pain in left hip: Secondary | ICD-10-CM | POA: Diagnosis not present

## 2022-12-18 DIAGNOSIS — M545 Low back pain, unspecified: Secondary | ICD-10-CM | POA: Diagnosis not present

## 2022-12-18 DIAGNOSIS — M6281 Muscle weakness (generalized): Secondary | ICD-10-CM | POA: Diagnosis not present

## 2022-12-19 DIAGNOSIS — Z23 Encounter for immunization: Secondary | ICD-10-CM | POA: Diagnosis not present

## 2022-12-23 DIAGNOSIS — M6281 Muscle weakness (generalized): Secondary | ICD-10-CM | POA: Diagnosis not present

## 2022-12-23 DIAGNOSIS — M5459 Other low back pain: Secondary | ICD-10-CM | POA: Diagnosis not present

## 2022-12-23 DIAGNOSIS — M545 Low back pain, unspecified: Secondary | ICD-10-CM | POA: Diagnosis not present

## 2022-12-23 DIAGNOSIS — M419 Scoliosis, unspecified: Secondary | ICD-10-CM | POA: Diagnosis not present

## 2022-12-23 DIAGNOSIS — M25552 Pain in left hip: Secondary | ICD-10-CM | POA: Diagnosis not present

## 2023-01-01 DIAGNOSIS — M545 Low back pain, unspecified: Secondary | ICD-10-CM | POA: Diagnosis not present

## 2023-01-01 DIAGNOSIS — M6281 Muscle weakness (generalized): Secondary | ICD-10-CM | POA: Diagnosis not present

## 2023-01-01 DIAGNOSIS — M5459 Other low back pain: Secondary | ICD-10-CM | POA: Diagnosis not present

## 2023-01-01 DIAGNOSIS — M25552 Pain in left hip: Secondary | ICD-10-CM | POA: Diagnosis not present

## 2023-01-01 DIAGNOSIS — M419 Scoliosis, unspecified: Secondary | ICD-10-CM | POA: Diagnosis not present

## 2023-01-06 DIAGNOSIS — M545 Low back pain, unspecified: Secondary | ICD-10-CM | POA: Diagnosis not present

## 2023-01-06 DIAGNOSIS — M419 Scoliosis, unspecified: Secondary | ICD-10-CM | POA: Diagnosis not present

## 2023-01-06 DIAGNOSIS — M6281 Muscle weakness (generalized): Secondary | ICD-10-CM | POA: Diagnosis not present

## 2023-01-06 DIAGNOSIS — M25552 Pain in left hip: Secondary | ICD-10-CM | POA: Diagnosis not present

## 2023-01-06 DIAGNOSIS — M5459 Other low back pain: Secondary | ICD-10-CM | POA: Diagnosis not present

## 2023-01-23 DIAGNOSIS — M25552 Pain in left hip: Secondary | ICD-10-CM | POA: Diagnosis not present

## 2023-01-23 DIAGNOSIS — M545 Low back pain, unspecified: Secondary | ICD-10-CM | POA: Diagnosis not present

## 2023-01-23 DIAGNOSIS — M419 Scoliosis, unspecified: Secondary | ICD-10-CM | POA: Diagnosis not present

## 2023-01-23 DIAGNOSIS — M5459 Other low back pain: Secondary | ICD-10-CM | POA: Diagnosis not present

## 2023-01-23 DIAGNOSIS — M6281 Muscle weakness (generalized): Secondary | ICD-10-CM | POA: Diagnosis not present

## 2023-02-09 DIAGNOSIS — Z961 Presence of intraocular lens: Secondary | ICD-10-CM | POA: Diagnosis not present

## 2023-02-09 DIAGNOSIS — H43812 Vitreous degeneration, left eye: Secondary | ICD-10-CM | POA: Diagnosis not present

## 2023-02-09 DIAGNOSIS — H35372 Puckering of macula, left eye: Secondary | ICD-10-CM | POA: Diagnosis not present

## 2023-02-09 DIAGNOSIS — H10413 Chronic giant papillary conjunctivitis, bilateral: Secondary | ICD-10-CM | POA: Diagnosis not present

## 2023-02-10 ENCOUNTER — Telehealth: Payer: Self-pay | Admitting: *Deleted

## 2023-02-10 DIAGNOSIS — M810 Age-related osteoporosis without current pathological fracture: Secondary | ICD-10-CM

## 2023-02-10 MED ORDER — DENOSUMAB 60 MG/ML ~~LOC~~ SOSY
60.0000 mg | PREFILLED_SYRINGE | Freq: Once | SUBCUTANEOUS | Status: AC
Start: 2023-02-12 — End: 2023-02-16
  Administered 2023-02-16: 60 mg via SUBCUTANEOUS

## 2023-02-10 NOTE — Telephone Encounter (Signed)
Last prolia 08/14/22  Patient due 02/14/23  CAM ORDER PLACED

## 2023-02-12 ENCOUNTER — Other Ambulatory Visit (HOSPITAL_COMMUNITY): Payer: Self-pay

## 2023-02-12 ENCOUNTER — Telehealth: Payer: Self-pay

## 2023-02-12 NOTE — Telephone Encounter (Signed)
Pt scheduled for 02/16/23.

## 2023-02-12 NOTE — Telephone Encounter (Signed)
Pt ready for scheduling for Prolia on or after : 02/14/23  Out-of-pocket cost due at time of visit: $0  Number of injection/visits approved: n/a  Primary: [employer] Medicare Prolia co-insurance: 0% Admin fee co-insurance: 0%  Secondary: [employer] - MEDSUP Prolia co-insurance: 100% Admin fee co-insurance:100%   Medical Benefit Details: Date Benefits were checked: 02/12/2023 Deductible: $240 (met)/ Coinsurance: 100%/ Admin Fee: 100%  Prior Auth: not required PA# Expiration Date:   # of doses approved:  Pharmacy benefit: Copay $-- If patient wants fill through the pharmacy benefit please send prescription to:  -- , and include estimated need by date in rx notes. Pharmacy will ship medication directly to the office.  Patient not eligible for Prolia Copay Card. Copay Card can make patient's cost as little as $25. Link to apply: https://www.[email or web address]/copay  ** This summary of benefits is an estimation of the patient's out-of-pocket cost. Exact cost may very based on individual plan coverage.

## 2023-02-12 NOTE — Telephone Encounter (Signed)
Checking to see if benefits have been ran.  Referral has been placed.

## 2023-02-15 DIAGNOSIS — J0100 Acute maxillary sinusitis, unspecified: Secondary | ICD-10-CM | POA: Diagnosis not present

## 2023-02-15 DIAGNOSIS — Z6821 Body mass index (BMI) 21.0-21.9, adult: Secondary | ICD-10-CM | POA: Diagnosis not present

## 2023-02-16 ENCOUNTER — Ambulatory Visit: Payer: Medicare Other

## 2023-02-16 DIAGNOSIS — M810 Age-related osteoporosis without current pathological fracture: Secondary | ICD-10-CM | POA: Diagnosis not present

## 2023-02-16 NOTE — Progress Notes (Signed)
Pt was in office today for a Prolia injection. Injection was given SubQ in L arm. Pt tolerated well.

## 2023-03-02 DIAGNOSIS — H26492 Other secondary cataract, left eye: Secondary | ICD-10-CM | POA: Diagnosis not present

## 2023-03-04 ENCOUNTER — Other Ambulatory Visit: Payer: Self-pay

## 2023-03-04 DIAGNOSIS — M810 Age-related osteoporosis without current pathological fracture: Secondary | ICD-10-CM

## 2023-03-04 MED ORDER — DENOSUMAB 60 MG/ML ~~LOC~~ SOSY
60.0000 mg | PREFILLED_SYRINGE | Freq: Once | SUBCUTANEOUS | Status: AC
Start: 2023-07-02 — End: 2023-08-25
  Administered 2023-08-25: 60 mg via SUBCUTANEOUS

## 2023-03-04 NOTE — Telephone Encounter (Signed)
 Prolia was done 02/16/23 Next is due 08/17/23 CAM placed.

## 2023-05-17 DIAGNOSIS — J069 Acute upper respiratory infection, unspecified: Secondary | ICD-10-CM | POA: Diagnosis not present

## 2023-05-17 DIAGNOSIS — J0140 Acute pansinusitis, unspecified: Secondary | ICD-10-CM | POA: Diagnosis not present

## 2023-05-17 DIAGNOSIS — R051 Acute cough: Secondary | ICD-10-CM | POA: Diagnosis not present

## 2023-05-17 DIAGNOSIS — Z6821 Body mass index (BMI) 21.0-21.9, adult: Secondary | ICD-10-CM | POA: Diagnosis not present

## 2023-06-12 ENCOUNTER — Other Ambulatory Visit: Payer: Self-pay | Admitting: Family

## 2023-06-12 DIAGNOSIS — Z1231 Encounter for screening mammogram for malignant neoplasm of breast: Secondary | ICD-10-CM

## 2023-06-15 ENCOUNTER — Ambulatory Visit
Admission: RE | Admit: 2023-06-15 | Discharge: 2023-06-15 | Disposition: A | Discharge status: Home / Self Care | Source: Ambulatory Visit | Attending: Family | Admitting: Family

## 2023-06-15 DIAGNOSIS — Z1231 Encounter for screening mammogram for malignant neoplasm of breast: Secondary | ICD-10-CM | POA: Diagnosis not present

## 2023-06-30 DIAGNOSIS — L308 Other specified dermatitis: Secondary | ICD-10-CM | POA: Diagnosis not present

## 2023-06-30 DIAGNOSIS — L821 Other seborrheic keratosis: Secondary | ICD-10-CM | POA: Diagnosis not present

## 2023-06-30 DIAGNOSIS — L820 Inflamed seborrheic keratosis: Secondary | ICD-10-CM | POA: Diagnosis not present

## 2023-06-30 DIAGNOSIS — L570 Actinic keratosis: Secondary | ICD-10-CM | POA: Diagnosis not present

## 2023-07-17 ENCOUNTER — Telehealth: Payer: Self-pay

## 2023-07-17 NOTE — Telephone Encounter (Signed)
 Michelle Cook

## 2023-07-17 NOTE — Telephone Encounter (Signed)
 Pt ready for scheduling for PROLIA  on or after : 08/17/23  Option# 1: Buy/Bill (Office supplied medication)  Out-of-pocket cost due at time of clinic visit: $0  Number of injection/visits approved: ---  Primary: MEDICARE Prolia  co-insurance: 0% Admin fee co-insurance: 0%  Secondary: [employer]-MEDSUP Prolia  co-insurance:  Admin fee co-insurance:   Medical Benefit Details: Date Benefits were checked: 07/17/23 Deductible: $257 Met of $257 Required/ Coinsurance: 0%/ Admin Fee: 0%  Prior Auth: N/A PA# Expiration Date:   # of doses approved: ----------------------------------------------------------------------- Option# 2- Med Obtained from pharmacy:  Pharmacy benefit: Copay $--- (Paid to pharmacy) Admin Fee: --- (Pay at clinic)  Prior Auth: --- PA# Expiration Date:   # of doses approved:   If patient wants fill through the pharmacy benefit please send prescription to: ---, and include estimated need by date in rx notes. Pharmacy will ship medication directly to the office.  Patient NOT eligible for Prolia  Copay Card. Copay Card can make patient's cost as little as $25. Link to apply: https://www.[email or web address]/copay  ** This summary of benefits is an estimation of the patient's out-of-pocket cost. Exact cost may very based on individual plan coverage.

## 2023-07-17 NOTE — Telephone Encounter (Signed)
 Prolia  VOB initiated via [email or web address]  Next Prolia  inj DUE: 08/17/23

## 2023-07-24 ENCOUNTER — Telehealth: Payer: Self-pay | Admitting: *Deleted

## 2023-07-24 NOTE — Telephone Encounter (Signed)
Pt scheduled for ov

## 2023-07-24 NOTE — Telephone Encounter (Signed)
 FYI Spoke with pt about scheduling her Prolia  injection.  She asked about her next bone density.  She is due on or around 02/17/24 of this year.  I advised her that we will be happy to order but she will need to be seen by Rice Chamorro as well and at that time we can order for her.  She stated that it was nothing wrong with her and I advised that we need to see at least once year when there is nothing wrong to update blood work for baseline.  She agreed to call back to make appt.

## 2023-08-21 ENCOUNTER — Ambulatory Visit (INDEPENDENT_AMBULATORY_CARE_PROVIDER_SITE_OTHER): Payer: Medicare Other | Admitting: *Deleted

## 2023-08-21 VITALS — Ht 64.0 in | Wt 122.0 lb

## 2023-08-21 DIAGNOSIS — Z0000 Encounter for general adult medical examination without abnormal findings: Secondary | ICD-10-CM | POA: Diagnosis not present

## 2023-08-21 DIAGNOSIS — Z1211 Encounter for screening for malignant neoplasm of colon: Secondary | ICD-10-CM

## 2023-08-21 NOTE — Addendum Note (Signed)
 Addended by: MAREE HUM A on: 08/21/2023 02:49 PM   Modules accepted: Level of Service

## 2023-08-21 NOTE — Patient Instructions (Addendum)
 Michelle Cook , Thank you for taking time out of your busy schedule to complete your Annual Wellness Visit with me. I enjoyed our conversation and look forward to speaking with you again next year. I, as well as your care team,  appreciate your ongoing commitment to your health goals. Please review the following plan we discussed and let me know if I can assist you in the future. Your Game plan/ To Do List        Referrals: If you haven't heard from the office you've been referred to, please reach out to them at the phone provided.   Let us  know if you do not receive your Cologuard kit within 1 week.  Follow up Visits: Next Medicare AWV with our clinical staff: 09/06/24 9am (telephone)    Next Office Visit with your provider: 08/25/23 10:20  Clinician Recommendations:  Aim for 30 minutes of exercise or brisk walking, 6-8 glasses of water, and 5 servings of fruits and vegetables each day.    Please check with [facility] about the last date of your Shingrix vaccine.     This is a list of the screening recommended for you and due dates:  Health Maintenance  Topic Date Due   Zoster (Shingles) Vaccine (1 of 2) Never done   Cologuard (Stool DNA test)  08/08/2023   Medicare Annual Wellness Visit  08/20/2023   Flu Shot  09/25/2023   Mammogram  06/14/2025   DTaP/Tdap/Td vaccine (3 - Td or Tdap) 05/09/2027   Pneumococcal Vaccine for age over 66  Completed   DEXA scan (bone density measurement)  Completed   Hepatitis C Screening  Completed   Hepatitis B Vaccine  Aged Out   HPV Vaccine  Aged Out   Meningitis B Vaccine  Aged Out   COVID-19 Vaccine  Discontinued    Advanced directives: (Copy Requested) Please bring a copy of your health care power of attorney and living will to the office to be added to your chart at your convenience. You can mail to [facility] [address] [place], [place] [address] or email to [email or web address] Advance Care Planning is important because  it:  [x]  Makes sure you receive the medical care that is consistent with your values, goals, and preferences  [x]  It provides guidance to your family and loved ones and reduces their decisional burden about whether or not they are making the right decisions based on your wishes.  Follow the link provided in your after visit summary or read over the paperwork we have mailed to you to help you started getting your Advance Directives in place. If you need assistance in completing these, please reach out to us  so that we can help you!  See attachments for Preventive Care and Fall Prevention Tips.

## 2023-08-21 NOTE — Progress Notes (Signed)
 Subjective:   Michelle Cook is a 73 y.o. who presents for a Medicare Wellness preventive visit.  As a reminder, Annual Wellness Visits don't include a physical exam, and some assessments may be limited, especially if this visit is performed virtually. We may recommend an in-person follow-up visit with your provider if needed.  Visit Complete: Virtual I connected with  Dhana L Aldous on 08/21/23 by a audio enabled telemedicine application and verified that I am speaking with the correct person using two identifiers.  Patient Location: Home  Provider Location: Office/Clinic  I discussed the limitations of evaluation and management by telemedicine. The patient expressed understanding and agreed to proceed.  Vital Signs: Because this visit was a virtual/telehealth visit, some criteria may be missing or patient reported. Any vitals not documented were not able to be obtained and vitals that have been documented are patient reported.  VideoDeclined- This patient declined Librarian, academic. Therefore the visit was completed with audio only.  Persons Participating in Visit: Patient.  AWV Questionnaire: Yes: Patient Medicare AWV questionnaire was completed by the patient on 08/20/23; I have confirmed that all information answered by patient is correct and no changes since this date.  Cardiac Risk Factors include: advanced age (>109men, >33 women);dyslipidemia     Objective:    Today's Vitals   08/21/23 0857  Weight: 122 lb (55.3 kg)  Height: 5' 4 (1.626 m)   Body mass index is 20.94 kg/m.     08/21/2023    9:16 AM 08/20/2022    9:03 AM 08/16/2021    9:02 AM 07/24/2020    8:15 AM 05/29/2020    9:41 AM 12/21/2018   12:21 PM  Advanced Directives  Does Patient Have a Medical Advance Directive? Yes Yes Yes Yes Yes Yes  Type of Advance Directive Living will Healthcare Power of [place];Living will Living will;Out of facility DNR (pink MOST or yellow  form) Healthcare Power of [place];Living will Living will;Healthcare Power of [employer] Power of [place];Living will  Does patient want to make changes to medical advance directive? No - Patient declined  No - Patient declined No - Patient declined No - Patient declined   Copy of Healthcare Power of Attorney in Chart?  No - copy requested  No - copy requested No - copy requested No - copy requested    Current Medications (verified) Outpatient Encounter Medications as of 08/21/2023  Medication Sig   Ascorbic Acid (VITAMIN C) 1000 MG tablet Take 1,000 mg by mouth daily.   Calcium Carb-Cholecalciferol (CALCIUM PLUS D3 ABSORBABLE) 600-62.5 MG-MCG CAPS  (Patient taking differently: Take 1 tablet by mouth in the morning and at bedtime.)   famotidine  (PEPCID ) 10 MG tablet Take 1 tablet (10 mg total) by mouth 2 (two) times daily.   Probiotic Product (PROBIOTIC DAILY) CAPS Take 1 each by mouth daily.   Facility-Administered Encounter Medications as of 08/21/2023  Medication   denosumab  (PROLIA ) injection 60 mg    Allergies (verified) Sulfamethoxazole-trimethoprim and Sulfa antibiotics   History: Past Medical History:  Diagnosis Date   Chicken pox    GERD (gastroesophageal reflux disease)    Hyperlipidemia    Osteoporosis 01/1922   Diagnosed with osteoporosis in December of 2023.   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   STD (sexually transmitted disease)    Herpes   Past Surgical History:  Procedure Laterality Date   EYE SURGERY  2022   Cataract surgery   TUBAL  LIGATION     Family History  Problem Relation Age of Onset   COPD Mother    Heart failure Mother    Hypertension Mother    Hypertension Sister    Hypertension Brother    Social History   Socioeconomic History   Marital status: Single    Spouse name: Not on file   Number of children: 2   Years of education: 12   Highest education level: Not on file  Occupational History    Occupation: retired  Tobacco Use   Smoking status: Never   Smokeless tobacco: Never  Vaping Use   Vaping status: Never Used  Substance and Sexual Activity   Alcohol use: Yes    Alcohol/week: 1.0 standard drink of alcohol    Types: 1 Glasses of wine per week    Comment: occasionally   Drug use: No   Sexual activity: Not Currently    Comment: 1st intercourse 73 yo-1 partner  Other Topics Concern   Not on file  Social History Narrative   Fun: [employer] tennis, [employer]   Denies abuse and feels safe at home.    Looking work part time.    Bake cakes.    Social Drivers of Corporate investment banker Strain: Low Risk  (08/21/2023)   Overall Financial Resource Strain (CARDIA)    Difficulty of Paying Living Expenses: Not hard at all  Food Insecurity: No Food Insecurity (08/21/2023)   Hunger Vital Sign    Worried About Running Out of Food in the Last Year: Never true    Ran Out of Food in the Last Year: Never true  Transportation Needs: No Transportation Needs (08/21/2023)   PRAPARE - Administrator, Civil Service (Medical): No    Lack of Transportation (Non-Medical): No  Physical Activity: Sufficiently Active (08/21/2023)   Exercise Vital Sign    Days of Exercise per Week: 3 days    Minutes of Exercise per Session: 90 min  Stress: No Stress Concern Present (08/21/2023)   [employer] of Occupational Health - Occupational Stress Questionnaire    Feeling of Stress: Not at all  Social Connections: Moderately Isolated (08/21/2023)   Social Connection and Isolation Panel    Frequency of Communication with Friends and Family: More than three times a week    Frequency of Social Gatherings with Friends and Family: More than three times a week    Attends Religious Services: Never    Database administrator or Organizations: Yes    Attends Engineer, structural: More than 4 times per year    Marital Status: Divorced    Tobacco Counseling Counseling given: Not  Answered    Clinical Intake:  Pre-visit preparation completed: Yes        BMI - recorded: 20.94 Nutritional Status: BMI of 19-24  Normal Nutritional Risks: None Diabetes: No  No results found for: HGBA1C   How often do you need to have someone help you when you read instructions, pamphlets, or other written materials from your doctor or pharmacy?: 1 - Never What is the last grade level you completed in school?: 12th  Interpreter Needed?: No  Information entered by :: Lolita Libra, CMA   Activities of Daily Living     08/20/2023    4:49 PM  In your present state of health, do you have any difficulty performing the following activities:  Hearing? 0  Vision? 0  Difficulty concentrating or making decisions? 0  Walking or climbing stairs? 0  Dressing or bathing? 0  Doing errands, shopping? 0  Preparing Food and eating ? N  Using the Toilet? N  In the past six months, have you accidently leaked urine? Y  Comment Has to go as soon as she feels the urge.  Do you have problems with loss of bowel control? N  Managing your Medications? N  Managing your Finances? N  Housekeeping or managing your Housekeeping? N    Patient Care Team: Jason Leita Repine, FNP as PCP - General (Internal Medicine) Octavia, Charlie Hamilton, MD as Consulting Physician (Ophthalmology)  I have updated your Care Teams any recent Medical Services you may have received from other providers in the past year.     Assessment:   This is a routine wellness examination for Jaeley.  Hearing/Vision screen Hearing Screening - Comments:: Denies hearing difficulties. Has difficulty if there is a lot of background noise. Vision Screening - Comments:: Wears RX glasses -- up to date with routine eye exams.    Goals Addressed               This Visit's Progress     COMPLETED: Patient Stated        I want to continue to play tennis as long as possible and work with weights to increase my muscle  strength.       Patient Stated (pt-stated)        She would like to continue playing pickle ball, continue doing PT exercises at home for muscle strengthening due to scoliosis.       Depression Screen     08/21/2023    9:06 AM 08/20/2022    9:04 AM 02/11/2022    8:13 AM 08/30/2021   10:03 AM 08/16/2021    9:04 AM 05/29/2020    9:40 AM 12/21/2018   12:28 PM  PHQ 2/9 Scores  PHQ - 2 Score 0 0 0 0 0 0 0  PHQ- 9 Score 2          Fall Risk     08/20/2023    4:49 PM 08/13/2022    9:09 AM 02/11/2022    8:13 AM 08/30/2021   10:02 AM 08/16/2021    9:03 AM  Fall Risk   Falls in the past year? 0 0 0 0 0  Number falls in past yr: 0 0 0 0 0  Injury with Fall? 0 0 0 0 0  Risk for fall due to : No Fall Risks No Fall Risks No Fall Risks  No Fall Risks  Follow up Falls evaluation completed Falls evaluation completed Falls evaluation completed   Falls evaluation completed      Data saved with a previous flowsheet row definition    MEDICARE RISK AT HOME:  Medicare Risk at Home Any stairs in or around the home?: (Patient-Rptd) Yes If so, are there any without handrails?: (Patient-Rptd) No Home free of loose throw rugs in walkways, pet beds, electrical cords, etc?: (Patient-Rptd) Yes Adequate lighting in your home to reduce risk of falls?: (Patient-Rptd) Yes Life alert?: (Patient-Rptd) No Use of a cane, walker or w/c?: (Patient-Rptd) No Grab bars in the bathroom?: (Patient-Rptd) No Shower chair or bench in shower?: (Patient-Rptd) No Elevated toilet seat or a handicapped toilet?: (Patient-Rptd) No  TIMED UP AND GO:  Was the test performed?  No,audio  Cognitive Function: 6CIT completed        08/21/2023    9:18 AM 08/20/2022    9:05 AM 08/16/2021    9:10 AM  6CIT Screen  What Year? 0 points 0 points 0 points  What month? 0 points 0 points 0 points  What time? 0 points 0 points 0 points  Count back from 20 0 points 0 points 0 points  Months in reverse 0 points 0 points 0 points  Repeat  phrase 0 points 0 points 2 points  Total Score 0 points 0 points 2 points    Immunizations Immunization History  Administered Date(s) Administered   Fluad Quad(high Dose 65+) 12/21/2018, 11/27/2021   Influenza, High Dose Seasonal PF 03/31/2018, 12/19/2022   PFIZER(Purple Top)SARS-COV-2 Vaccination 03/24/2019, 04/12/2019, 11/28/2019   Pfizer Covid-19 Vaccine Bivalent Booster 72yrs & up 03/01/2021   Pfizer(Comirnaty)Fall Seasonal Vaccine 12 years and older 12/27/2021   Pneumococcal Conjugate-13 03/11/2016   Pneumococcal Polysaccharide-23 05/29/2020   Td 12/02/2008   Tdap 05/08/2017   Zoster Recombinant(Shingrix) 03/03/2022    Screening Tests Health Maintenance  Topic Date Due   Zoster Vaccines- Shingrix (2 of 2) 04/28/2022   Fecal DNA (Cologuard)  08/08/2023   Medicare Annual Wellness (AWV)  08/20/2023   INFLUENZA VACCINE  09/25/2023   MAMMOGRAM  06/14/2025   DTaP/Tdap/Td (3 - Td or Tdap) 05/09/2027   Pneumococcal Vaccine: 50+ Years  Completed   DEXA SCAN  Completed   Hepatitis C Screening  Completed   Hepatitis B Vaccines  Aged Out   HPV VACCINES  Aged Out   Meningococcal B Vaccine  Aged Out   COVID-19 Vaccine  Discontinued    Health Maintenance  Health Maintenance Due  Topic Date Due   Zoster Vaccines- Shingrix (2 of 2) 04/28/2022   Fecal DNA (Cologuard)  08/08/2023   Medicare Annual Wellness (AWV)  08/20/2023   Health Maintenance Items Addressed: Will get date of 2nd Shingrix from pharmacy. Cologuard ordered.  Additional Screening:  Vision Screening: Recommended annual ophthalmology exams for early detection of glaucoma and other disorders of the eye. Would you like a referral to an eye doctor? No    Dental Screening: Recommended annual dental exams for proper oral hygiene  Community Resource Referral / Chronic Care Management: CRR required this visit?  No   CCM required this visit?  No   Plan:    I have personally reviewed and noted the following in  the patient's chart:   Medical and social history Use of alcohol, tobacco or illicit drugs  Current medications and supplements including opioid prescriptions. Patient is not currently taking opioid prescriptions. Functional ability and status Nutritional status Physical activity Advanced directives List of other physicians Hospitalizations, surgeries, and ER visits in previous 12 months Vitals Screenings to include cognitive, depression, and falls Referrals and appointments  In addition, I have reviewed and discussed with patient certain preventive protocols, quality metrics, and best practice recommendations. A written personalized care plan for preventive services as well as general preventive health recommendations were provided to patient.   Lolita Libra, CMA   08/21/2023   After Visit Summary: (MyChart) Due to this being a telephonic visit, the after visit summary with patients personalized plan was offered to patient via MyChart   Notes: Nothing significant to report at this time.

## 2023-08-25 ENCOUNTER — Ambulatory Visit: Admitting: Family

## 2023-08-25 ENCOUNTER — Ambulatory Visit

## 2023-08-25 ENCOUNTER — Encounter: Payer: Self-pay | Admitting: Family

## 2023-08-25 VITALS — BP 120/70 | HR 63 | Ht 64.0 in | Wt 123.0 lb

## 2023-08-25 DIAGNOSIS — R252 Cramp and spasm: Secondary | ICD-10-CM | POA: Diagnosis not present

## 2023-08-25 DIAGNOSIS — E559 Vitamin D deficiency, unspecified: Secondary | ICD-10-CM | POA: Diagnosis not present

## 2023-08-25 DIAGNOSIS — M810 Age-related osteoporosis without current pathological fracture: Secondary | ICD-10-CM | POA: Diagnosis not present

## 2023-08-25 DIAGNOSIS — Z13220 Encounter for screening for lipoid disorders: Secondary | ICD-10-CM | POA: Diagnosis not present

## 2023-08-25 DIAGNOSIS — R7309 Other abnormal glucose: Secondary | ICD-10-CM

## 2023-08-25 MED ORDER — DENOSUMAB 60 MG/ML ~~LOC~~ SOSY
60.0000 mg | PREFILLED_SYRINGE | SUBCUTANEOUS | Status: AC
  Administered 2024-03-04: 60 mg via SUBCUTANEOUS

## 2023-08-25 NOTE — Progress Notes (Signed)
 Michelle Cook is a 73 y.o. female with the following history as recorded in EpicCare:  Patient Active Problem List   Diagnosis Date Noted   COVID-19 04/28/2022   Abnormality of lung on chest x-ray 08/25/2018   History of bronchitis 08/25/2018   Medicare annual wellness visit, initial 03/11/2016   Routine general medical examination at a health care facility 03/11/2016   Acute bronchitis 01/29/2013   URI 12/12/2009   HYPERLIPIDEMIA 12/06/2008    Current Outpatient Medications  Medication Sig Dispense Refill   Ascorbic Acid (VITAMIN C) 1000 MG tablet Take 1,000 mg by mouth daily.     Calcium Carb-Cholecalciferol (CALCIUM PLUS D3 ABSORBABLE) 600-62.5 MG-MCG CAPS  (Patient taking differently: Take 1 tablet by mouth in the morning and at bedtime.)     famotidine  (PEPCID ) 10 MG tablet Take 1 tablet (10 mg total) by mouth 2 (two) times daily. 180 tablet 3   Probiotic Product (PROBIOTIC DAILY) CAPS Take 1 each by mouth daily.     Current Facility-Administered Medications  Medication Dose Route Frequency Provider Last Rate Last Admin   denosumab  (PROLIA ) injection 60 mg  60 mg Subcutaneous Once Jason Leita Repine, FNP        Allergies: Sulfamethoxazole-trimethoprim and Sulfa antibiotics  Past Medical History:  Diagnosis Date   Chicken pox    GERD (gastroesophageal reflux disease)    Hyperlipidemia    Osteoporosis 01/1922   Diagnosed with osteoporosis in December of 2023.   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   STD (sexually transmitted disease)    Herpes    Past Surgical History:  Procedure Laterality Date   EYE SURGERY Bilateral 2022   Cataract surgery   TUBAL LIGATION      Family History  Problem Relation Age of Onset   COPD Mother    Heart failure Mother    Hypertension Mother    Hypertension Sister    Hypertension Brother     Social History   Tobacco Use   Smoking status: Never   Smokeless tobacco: Never  Substance Use Topics    Alcohol use: Yes    Alcohol/week: 1.0 standard drink of alcohol    Types: 1 Glasses of wine per week    Comment: occasionally    Subjective:   Presents for follow up on chronic conditions; due for Prolia  today- will be due for DEXA in December 2025;  Mammogram up to date; cologuard was ordered last [facility] at [facility];  Sees dermatology regularly;    Objective:  Vitals:   08/25/23 1016  BP: 120/70  Pulse: 63  SpO2: 100%  Weight: 123 lb (55.8 kg)  Height: 5' 4 (1.626 m)    General: Well developed, well nourished, in no acute distress  Skin : Warm and dry.  Head: Normocephalic and atraumatic  Eyes: Sclera and conjunctiva clear; pupils round and reactive to light; extraocular movements intact  Ears: External normal; canals clear; tympanic membranes normal  Oropharynx: Pink, supple. No suspicious lesions  Neck: Supple without thyromegaly, adenopathy  Lungs: Respirations unlabored; clear to auscultation bilaterally without wheeze, rales, rhonchi  [facility] exam: normal rate and regular rhythm.  Neurologic: Alert and oriented; speech intact; face symmetrical; moves all extremities well; CNII-XII intact without focal deficit   Assessment:  1. Osteoporosis, unspecified osteoporosis type, unspecified pathological fracture presence   2. Lipid screening   3. Muscle cramps   4. Vitamin D  deficiency   5. Elevated glucose     Plan:  Prolia  to be done today; plan for DEXA in December 2025;  Patient will return for fasting labs at later date; she will call back if Cologuard does not arrive at her home in the next 1-2 weeks;   Return for follow up for fasting labs.  Orders Placed This Encounter  Procedures   CBC with Differential/Platelet    Standing Status:   Future    Expiration Date:   08/24/2024   Comp Met (CMET)    Standing Status:   Future    Expiration Date:   08/24/2024   Lipid panel    Standing Status:   Future    Expiration Date:   08/24/2024   Magnesium    Standing Status:   Future     Expiration Date:   08/24/2024   B12    Standing Status:   Future    Expiration Date:   08/24/2024   Vitamin D  (25 hydroxy)    Standing Status:   Future    Expiration Date:   08/24/2024   Hemoglobin A1c    Standing Status:   Future    Expiration Date:   08/24/2024    Requested Prescriptions    No prescriptions requested or ordered in this encounter

## 2023-08-27 ENCOUNTER — Other Ambulatory Visit (INDEPENDENT_AMBULATORY_CARE_PROVIDER_SITE_OTHER)

## 2023-08-27 DIAGNOSIS — R252 Cramp and spasm: Secondary | ICD-10-CM | POA: Diagnosis not present

## 2023-08-27 DIAGNOSIS — Z13220 Encounter for screening for lipoid disorders: Secondary | ICD-10-CM | POA: Diagnosis not present

## 2023-08-27 DIAGNOSIS — R7309 Other abnormal glucose: Secondary | ICD-10-CM

## 2023-08-27 DIAGNOSIS — E559 Vitamin D deficiency, unspecified: Secondary | ICD-10-CM | POA: Diagnosis not present

## 2023-08-27 DIAGNOSIS — M810 Age-related osteoporosis without current pathological fracture: Secondary | ICD-10-CM | POA: Diagnosis not present

## 2023-08-27 LAB — COMPREHENSIVE METABOLIC PANEL WITH GFR
ALT: 14 U/L (ref 0–35)
AST: 16 U/L (ref 0–37)
Albumin: 4 g/dL (ref 3.5–5.2)
Alkaline Phosphatase: 53 U/L (ref 39–117)
BUN: 16 mg/dL (ref 6–23)
CO2: 31 meq/L (ref 19–32)
Calcium: 9.3 mg/dL (ref 8.4–10.5)
Chloride: 107 meq/L (ref 96–112)
Creatinine, Ser: 0.95 mg/dL (ref 0.40–1.20)
GFR: 59.61 mL/min — ABNORMAL LOW (ref >60.00)
Glucose, Bld: 92 mg/dL (ref 70–99)
Potassium: 4 meq/L (ref 3.5–5.1)
Sodium: 143 meq/L (ref 135–145)
Total Bilirubin: 0.8 mg/dL (ref 0.2–1.2)
Total Protein: 6.9 g/dL (ref 6.0–8.3)

## 2023-08-27 LAB — LIPID PANEL
Cholesterol: 205 mg/dL — ABNORMAL HIGH (ref 0–200)
HDL: 46.9 mg/dL (ref >39.00)
LDL Cholesterol: 134 mg/dL — ABNORMAL HIGH (ref 0–99)
NonHDL: 158.44
Total CHOL/HDL Ratio: 4
Triglycerides: 122 mg/dL (ref 0.0–149.0)
VLDL: 24.4 mg/dL (ref 0.0–40.0)

## 2023-08-27 LAB — CBC WITH DIFFERENTIAL/PLATELET
Basophils Absolute: 0.1 10*3/uL (ref 0.0–0.1)
Basophils Relative: 3.3 % — ABNORMAL HIGH (ref 0.0–3.0)
Eosinophils Absolute: 0.1 10*3/uL (ref 0.0–0.7)
Eosinophils Relative: 3.3 % (ref 0.0–5.0)
HCT: 41.4 % (ref 36.0–46.0)
Hemoglobin: 13.6 g/dL (ref 12.0–15.0)
Lymphocytes Relative: 33.8 % (ref 12.0–46.0)
Lymphs Abs: 1.3 10*3/uL (ref 0.7–4.0)
MCHC: 33 g/dL (ref 30.0–36.0)
MCV: 83.7 fl (ref 78.0–100.0)
Monocytes Absolute: 0.8 10*3/uL (ref 0.1–1.0)
Monocytes Relative: 19.4 % — ABNORMAL HIGH (ref 3.0–12.0)
Neutro Abs: 1.6 10*3/uL (ref 1.4–7.7)
Neutrophils Relative %: 40.2 % — ABNORMAL LOW (ref 43.0–77.0)
Platelets: 288 10*3/uL (ref 150.0–400.0)
RBC: 4.94 Mil/uL (ref 3.87–5.11)
RDW: 13.7 % (ref 11.5–15.5)
WBC: 4 10*3/uL (ref 4.0–10.5)

## 2023-08-27 LAB — MAGNESIUM: Magnesium: 2.1 mg/dL (ref 1.5–2.5)

## 2023-08-27 LAB — VITAMIN B12: Vitamin B-12: 454 pg/mL (ref 211–911)

## 2023-08-27 LAB — VITAMIN D 25 HYDROXY (VIT D DEFICIENCY, FRACTURES): VITD: 28.09 ng/mL — ABNORMAL LOW (ref 30.00–100.00)

## 2023-08-27 LAB — HEMOGLOBIN A1C: Hgb A1c MFr Bld: 6 % (ref 4.6–6.5)

## 2023-09-01 ENCOUNTER — Ambulatory Visit: Payer: Self-pay | Admitting: Family

## 2023-09-02 ENCOUNTER — Other Ambulatory Visit: Payer: Self-pay | Admitting: Family

## 2023-09-02 DIAGNOSIS — R7303 Prediabetes: Secondary | ICD-10-CM

## 2023-09-11 ENCOUNTER — Telehealth: Payer: Self-pay | Admitting: Family

## 2023-09-11 NOTE — Telephone Encounter (Unsigned)
 Copied from CRM [id number]. Topic: Clinical - Medical Advice >> Sep 11, 2023 11:23 AM Tiffini S wrote: Reason for CRM: Patient have received information in mail for a Calcium Scan and she is asking for a call back at [phone] for more information.

## 2023-09-14 NOTE — Telephone Encounter (Signed)
 Spoke with pt, pt states she would like to move forward with the calcium scan.

## 2023-09-21 ENCOUNTER — Other Ambulatory Visit: Payer: Self-pay | Admitting: Family

## 2023-09-21 DIAGNOSIS — E7800 Pure hypercholesterolemia, unspecified: Secondary | ICD-10-CM

## 2023-09-30 ENCOUNTER — Ambulatory Visit (HOSPITAL_BASED_OUTPATIENT_CLINIC_OR_DEPARTMENT_OTHER)
Admission: RE | Admit: 2023-09-30 | Discharge: 2023-09-30 | Disposition: A | Discharge status: Home / Self Care | Payer: Self-pay | Source: Ambulatory Visit | Attending: Family | Admitting: Family

## 2023-09-30 DIAGNOSIS — E7800 Pure hypercholesterolemia, unspecified: Secondary | ICD-10-CM | POA: Insufficient documentation

## 2023-10-03 DIAGNOSIS — Z1211 Encounter for screening for malignant neoplasm of colon: Secondary | ICD-10-CM | POA: Diagnosis not present

## 2023-10-06 ENCOUNTER — Ambulatory Visit: Payer: Self-pay | Admitting: Family

## 2023-10-08 ENCOUNTER — Other Ambulatory Visit: Payer: Self-pay | Admitting: Family

## 2023-10-08 MED ORDER — ATORVASTATIN CALCIUM 10 MG PO TABS: 10.0000 mg | ORAL_TABLET | Freq: Every day | ORAL | 1 refills | Status: AC

## 2023-10-10 LAB — COLOGUARD: COLOGUARD: NEGATIVE

## 2024-02-16 ENCOUNTER — Telehealth: Payer: Self-pay | Admitting: *Deleted

## 2024-02-16 NOTE — Telephone Encounter (Signed)
 Spoke with patient and she would like to continue coming here.  She needs a TOC appointment scheduled.

## 2024-02-16 NOTE — Telephone Encounter (Signed)
 Pt notified that there will be a delay on getting injection due to ins being ran.  Ins will be ran after 02/25/24 due to it being a new year.

## 2024-02-16 NOTE — Telephone Encounter (Signed)
 Pt is scheduled

## 2024-02-26 ENCOUNTER — Telehealth: Payer: Self-pay

## 2024-02-26 NOTE — Telephone Encounter (Signed)
 Prolia VOB initiated via [email or web address]  Next Prolia inj DUE: NOW

## 2024-03-02 ENCOUNTER — Other Ambulatory Visit (HOSPITAL_COMMUNITY): Payer: Self-pay

## 2024-03-02 NOTE — Telephone Encounter (Signed)
 Michelle Cook

## 2024-03-02 NOTE — Telephone Encounter (Signed)
 Pt ready for scheduling for PROLIA  on or after : 03/02/24  Option# 1: Buy/Bill (Office supplied medication)  Out-of-pocket cost due at time of clinic visit: $283  Number of injection/visits approved: ---  Primary: MEDICARE Prolia  co-insurance: 0% Admin fee co-insurance: 0%  Secondary: AETNA-MEDSUP Prolia  co-insurance: covers co-insurance and 100% of the excess charges. This plan does not cover the Medicare Part B deductible Admin fee co-insurance:   Medical Benefit Details: Date Benefits were checked: 02/26/24 Deductible: $0 Met of $283 Required/ Coinsurance: 0%/ Admin Fee: 0%  Prior Auth: N/A PA# Expiration Date:   # of doses approved: ----------------------------------------------------------------------- Option# 2- Med Obtained from pharmacy:  Pharmacy benefit: Copay $1,145.82 (Paid to pharmacy) Admin Fee: 0% (Pay at clinic)  Prior Auth: N/A PA# Expiration Date:   # of doses approved:   If patient wants fill through the pharmacy benefit please send prescription to: [facility], and include estimated need by date in rx notes. Pharmacy will ship medication directly to the office.  Patient NOT eligible for Prolia  Copay Card. Copay Card can make patient's cost as little as $25. Link to apply: https://www.amgensupportplus.com/copay  ** This summary of benefits is an estimation of the patient's out-of-pocket cost. Exact cost may very based on individual plan coverage.

## 2024-03-04 ENCOUNTER — Ambulatory Visit (INDEPENDENT_AMBULATORY_CARE_PROVIDER_SITE_OTHER): Admitting: *Deleted

## 2024-03-04 DIAGNOSIS — M810 Age-related osteoporosis without current pathological fracture: Secondary | ICD-10-CM

## 2024-03-04 MED ORDER — DENOSUMAB 60 MG/ML ~~LOC~~ SOSY: 60.0000 mg | PREFILLED_SYRINGE | SUBCUTANEOUS | Status: AC

## 2024-03-04 NOTE — Progress Notes (Signed)
 Patient here for Prolia  injection per physicians orders  Prolia  60 mg SQ , was administered left arm today. Patient tolerated injection.  Patient next injection due: 6 months, appt made:  No- will schedule in 5 months after benefits are ran again  Initial injection: no  Did Prolia  come from pharmacy (if yes please select patient supplied): no  Cam placed for next injection: yes

## 2024-06-09 ENCOUNTER — Encounter: Admitting: Student

## 2024-09-06 ENCOUNTER — Ambulatory Visit
